# Patient Record
Sex: Male | Born: 1970 | Race: Black or African American | Hispanic: No | Marital: Married | Smoking: Never smoker
Health system: Southern US, Community
[De-identification: ages and names within clinical notes are randomized; demographics above are authoritative.]

## PROBLEM LIST (undated history)

## (undated) DIAGNOSIS — I10 Essential (primary) hypertension: Secondary | ICD-10-CM

## (undated) DIAGNOSIS — N289 Disorder of kidney and ureter, unspecified: Secondary | ICD-10-CM

## (undated) DIAGNOSIS — E785 Hyperlipidemia, unspecified: Secondary | ICD-10-CM

## (undated) DIAGNOSIS — E119 Type 2 diabetes mellitus without complications: Secondary | ICD-10-CM

## (undated) DIAGNOSIS — F329 Major depressive disorder, single episode, unspecified: Secondary | ICD-10-CM

## (undated) DIAGNOSIS — Z992 Dependence on renal dialysis: Secondary | ICD-10-CM

## (undated) DIAGNOSIS — N186 End stage renal disease: Secondary | ICD-10-CM

## (undated) DIAGNOSIS — F32A Depression, unspecified: Secondary | ICD-10-CM

## (undated) HISTORY — PX: EYE SURGERY: SHX253

---

## 2018-10-24 ENCOUNTER — Inpatient Hospital Stay (HOSPITAL_BASED_OUTPATIENT_CLINIC_OR_DEPARTMENT_OTHER)
Admission: EM | Admit: 2018-10-24 | Discharge: 2018-10-26 | DRG: 380 | Disposition: A | Discharge status: Home / Self Care | Payer: BLUE CROSS/BLUE SHIELD | Attending: Internal Medicine | Admitting: Internal Medicine

## 2018-10-24 ENCOUNTER — Other Ambulatory Visit: Payer: Self-pay

## 2018-10-24 ENCOUNTER — Encounter (HOSPITAL_BASED_OUTPATIENT_CLINIC_OR_DEPARTMENT_OTHER): Payer: Self-pay | Admitting: *Deleted

## 2018-10-24 ENCOUNTER — Emergency Department (HOSPITAL_BASED_OUTPATIENT_CLINIC_OR_DEPARTMENT_OTHER): Payer: BLUE CROSS/BLUE SHIELD

## 2018-10-24 DIAGNOSIS — I708 Atherosclerosis of other arteries: Secondary | ICD-10-CM | POA: Diagnosis present

## 2018-10-24 DIAGNOSIS — S0219XA Other fracture of base of skull, initial encounter for closed fracture: Secondary | ICD-10-CM | POA: Diagnosis present

## 2018-10-24 DIAGNOSIS — I6330 Cerebral infarction due to thrombosis of unspecified cerebral artery: Secondary | ICD-10-CM | POA: Diagnosis not present

## 2018-10-24 DIAGNOSIS — L97429 Non-pressure chronic ulcer of left heel and midfoot with unspecified severity: Secondary | ICD-10-CM | POA: Diagnosis present

## 2018-10-24 DIAGNOSIS — E876 Hypokalemia: Secondary | ICD-10-CM | POA: Diagnosis present

## 2018-10-24 DIAGNOSIS — E1122 Type 2 diabetes mellitus with diabetic chronic kidney disease: Secondary | ICD-10-CM | POA: Diagnosis present

## 2018-10-24 DIAGNOSIS — K3189 Other diseases of stomach and duodenum: Secondary | ICD-10-CM | POA: Diagnosis not present

## 2018-10-24 DIAGNOSIS — R7989 Other specified abnormal findings of blood chemistry: Secondary | ICD-10-CM | POA: Diagnosis present

## 2018-10-24 DIAGNOSIS — Z86010 Personal history of colonic polyps: Secondary | ICD-10-CM

## 2018-10-24 DIAGNOSIS — K2210 Ulcer of esophagus without bleeding: Secondary | ICD-10-CM

## 2018-10-24 DIAGNOSIS — W1809XA Striking against other object with subsequent fall, initial encounter: Secondary | ICD-10-CM | POA: Diagnosis present

## 2018-10-24 DIAGNOSIS — Z992 Dependence on renal dialysis: Secondary | ICD-10-CM | POA: Diagnosis not present

## 2018-10-24 DIAGNOSIS — I10 Essential (primary) hypertension: Secondary | ICD-10-CM | POA: Diagnosis not present

## 2018-10-24 DIAGNOSIS — K3184 Gastroparesis: Secondary | ICD-10-CM | POA: Diagnosis present

## 2018-10-24 DIAGNOSIS — E1151 Type 2 diabetes mellitus with diabetic peripheral angiopathy without gangrene: Secondary | ICD-10-CM | POA: Diagnosis present

## 2018-10-24 DIAGNOSIS — S0012XA Contusion of left eyelid and periocular area, initial encounter: Secondary | ICD-10-CM | POA: Diagnosis present

## 2018-10-24 DIAGNOSIS — E1143 Type 2 diabetes mellitus with diabetic autonomic (poly)neuropathy: Secondary | ICD-10-CM | POA: Diagnosis present

## 2018-10-24 DIAGNOSIS — R55 Syncope and collapse: Secondary | ICD-10-CM | POA: Diagnosis present

## 2018-10-24 DIAGNOSIS — E860 Dehydration: Secondary | ICD-10-CM | POA: Diagnosis present

## 2018-10-24 DIAGNOSIS — I428 Other cardiomyopathies: Secondary | ICD-10-CM | POA: Diagnosis present

## 2018-10-24 DIAGNOSIS — K2970 Gastritis, unspecified, without bleeding: Secondary | ICD-10-CM | POA: Diagnosis present

## 2018-10-24 DIAGNOSIS — I6381 Other cerebral infarction due to occlusion or stenosis of small artery: Secondary | ICD-10-CM | POA: Diagnosis present

## 2018-10-24 DIAGNOSIS — E1142 Type 2 diabetes mellitus with diabetic polyneuropathy: Secondary | ICD-10-CM | POA: Diagnosis present

## 2018-10-24 DIAGNOSIS — I70209 Unspecified atherosclerosis of native arteries of extremities, unspecified extremity: Secondary | ICD-10-CM | POA: Diagnosis present

## 2018-10-24 DIAGNOSIS — E785 Hyperlipidemia, unspecified: Secondary | ICD-10-CM | POA: Diagnosis present

## 2018-10-24 DIAGNOSIS — K2211 Ulcer of esophagus with bleeding: Principal | ICD-10-CM | POA: Diagnosis present

## 2018-10-24 DIAGNOSIS — Z833 Family history of diabetes mellitus: Secondary | ICD-10-CM

## 2018-10-24 DIAGNOSIS — I639 Cerebral infarction, unspecified: Secondary | ICD-10-CM

## 2018-10-24 DIAGNOSIS — Z79899 Other long term (current) drug therapy: Secondary | ICD-10-CM

## 2018-10-24 DIAGNOSIS — N186 End stage renal disease: Secondary | ICD-10-CM | POA: Diagnosis present

## 2018-10-24 DIAGNOSIS — F329 Major depressive disorder, single episode, unspecified: Secondary | ICD-10-CM | POA: Diagnosis present

## 2018-10-24 DIAGNOSIS — K920 Hematemesis: Secondary | ICD-10-CM | POA: Diagnosis present

## 2018-10-24 DIAGNOSIS — K2990 Gastroduodenitis, unspecified, without bleeding: Secondary | ICD-10-CM | POA: Diagnosis present

## 2018-10-24 DIAGNOSIS — E11621 Type 2 diabetes mellitus with foot ulcer: Secondary | ICD-10-CM | POA: Diagnosis present

## 2018-10-24 DIAGNOSIS — D631 Anemia in chronic kidney disease: Secondary | ICD-10-CM | POA: Diagnosis present

## 2018-10-24 DIAGNOSIS — I509 Heart failure, unspecified: Secondary | ICD-10-CM | POA: Diagnosis present

## 2018-10-24 DIAGNOSIS — Y92002 Bathroom of unspecified non-institutional (private) residence single-family (private) house as the place of occurrence of the external cause: Secondary | ICD-10-CM

## 2018-10-24 DIAGNOSIS — F32A Depression, unspecified: Secondary | ICD-10-CM | POA: Diagnosis present

## 2018-10-24 DIAGNOSIS — K208 Other esophagitis: Secondary | ICD-10-CM | POA: Diagnosis not present

## 2018-10-24 DIAGNOSIS — E119 Type 2 diabetes mellitus without complications: Secondary | ICD-10-CM | POA: Insufficient documentation

## 2018-10-24 DIAGNOSIS — R6881 Early satiety: Secondary | ICD-10-CM | POA: Diagnosis present

## 2018-10-24 DIAGNOSIS — R778 Other specified abnormalities of plasma proteins: Secondary | ICD-10-CM | POA: Diagnosis present

## 2018-10-24 DIAGNOSIS — Z7902 Long term (current) use of antithrombotics/antiplatelets: Secondary | ICD-10-CM

## 2018-10-24 DIAGNOSIS — I132 Hypertensive heart and chronic kidney disease with heart failure and with stage 5 chronic kidney disease, or end stage renal disease: Secondary | ICD-10-CM | POA: Diagnosis present

## 2018-10-24 HISTORY — DX: Type 2 diabetes mellitus without complications: E11.9

## 2018-10-24 HISTORY — DX: Depression, unspecified: F32.A

## 2018-10-24 HISTORY — DX: Dependence on renal dialysis: Z99.2

## 2018-10-24 HISTORY — DX: Hyperlipidemia, unspecified: E78.5

## 2018-10-24 HISTORY — DX: End stage renal disease: N18.6

## 2018-10-24 HISTORY — DX: Disorder of kidney and ureter, unspecified: N28.9

## 2018-10-24 HISTORY — DX: Essential (primary) hypertension: I10

## 2018-10-24 HISTORY — DX: Major depressive disorder, single episode, unspecified: F32.9

## 2018-10-24 LAB — APTT: aPTT: 30 seconds (ref 24–36)

## 2018-10-24 LAB — BASIC METABOLIC PANEL
Anion gap: 13 (ref 5–15)
BUN: 43 mg/dL — ABNORMAL HIGH (ref 6–20)
CO2: 28 mmol/L (ref 22–32)
Calcium: 8.1 mg/dL — ABNORMAL LOW (ref 8.9–10.3)
Chloride: 92 mmol/L — ABNORMAL LOW (ref 98–111)
Creatinine, Ser: 11.4 mg/dL — ABNORMAL HIGH (ref 0.61–1.24)
GFR calc Af Amer: 5 mL/min — ABNORMAL LOW (ref >60)
GFR calc non Af Amer: 5 mL/min — ABNORMAL LOW (ref >60)
Glucose, Bld: 141 mg/dL — ABNORMAL HIGH (ref 70–99)
Potassium: 3.1 mmol/L — ABNORMAL LOW (ref 3.5–5.1)
Sodium: 133 mmol/L — ABNORMAL LOW (ref 135–145)

## 2018-10-24 LAB — CBC
HCT: 33.8 % — ABNORMAL LOW (ref 39.0–52.0)
Hemoglobin: 10.9 g/dL — ABNORMAL LOW (ref 13.0–17.0)
MCH: 29.5 pg (ref 26.0–34.0)
MCHC: 32.2 g/dL (ref 30.0–36.0)
MCV: 91.6 fL (ref 80.0–100.0)
Platelets: 172 10*3/uL (ref 150–400)
RBC: 3.69 MIL/uL — ABNORMAL LOW (ref 4.22–5.81)
RDW: 14.3 % (ref 11.5–15.5)
WBC: 8.5 10*3/uL (ref 4.0–10.5)
nRBC: 0 % (ref 0.0–0.2)

## 2018-10-24 LAB — CBC WITH DIFFERENTIAL/PLATELET
Abs Immature Granulocytes: 0.02 10*3/uL (ref 0.00–0.07)
Basophils Absolute: 0 10*3/uL (ref 0.0–0.1)
Basophils Relative: 1 %
Eosinophils Absolute: 0.1 10*3/uL (ref 0.0–0.5)
Eosinophils Relative: 1 %
HCT: 34.4 % — ABNORMAL LOW (ref 39.0–52.0)
Hemoglobin: 11.2 g/dL — ABNORMAL LOW (ref 13.0–17.0)
Immature Granulocytes: 0 %
Lymphocytes Relative: 18 %
Lymphs Abs: 1.4 10*3/uL (ref 0.7–4.0)
MCH: 30.3 pg (ref 26.0–34.0)
MCHC: 32.6 g/dL (ref 30.0–36.0)
MCV: 93 fL (ref 80.0–100.0)
Monocytes Absolute: 0.8 10*3/uL (ref 0.1–1.0)
Monocytes Relative: 11 %
Neutro Abs: 5.3 10*3/uL (ref 1.7–7.7)
Neutrophils Relative %: 69 %
Platelets: 163 10*3/uL (ref 150–400)
RBC: 3.7 MIL/uL — ABNORMAL LOW (ref 4.22–5.81)
RDW: 14.4 % (ref 11.5–15.5)
WBC: 7.6 10*3/uL (ref 4.0–10.5)
nRBC: 0 % (ref 0.0–0.2)

## 2018-10-24 LAB — TYPE AND SCREEN
ABO/RH(D): A POS
Antibody Screen: NEGATIVE

## 2018-10-24 LAB — HEPATIC FUNCTION PANEL
ALT: 15 U/L (ref 0–44)
AST: 18 U/L (ref 15–41)
Albumin: 3.1 g/dL — ABNORMAL LOW (ref 3.5–5.0)
Alkaline Phosphatase: 47 U/L (ref 38–126)
Bilirubin, Direct: 0.2 mg/dL (ref 0.0–0.2)
Indirect Bilirubin: 0.6 mg/dL (ref 0.3–0.9)
Total Bilirubin: 0.8 mg/dL (ref 0.3–1.2)
Total Protein: 6.5 g/dL (ref 6.5–8.1)

## 2018-10-24 LAB — PROTIME-INR
INR: 1.1 (ref 0.8–1.2)
Prothrombin Time: 13.9 seconds (ref 11.4–15.2)

## 2018-10-24 LAB — CBG MONITORING, ED: Glucose-Capillary: 131 mg/dL — ABNORMAL HIGH (ref 70–99)

## 2018-10-24 LAB — TROPONIN I: Troponin I: 0.13 ng/mL (ref <0.03)

## 2018-10-24 LAB — GLUCOSE, CAPILLARY: Glucose-Capillary: 96 mg/dL (ref 70–99)

## 2018-10-24 MED ORDER — OXYCODONE-ACETAMINOPHEN 5-325 MG PO TABS: 1.0000 | ORAL_TABLET | ORAL | Status: DC | PRN

## 2018-10-24 MED ORDER — HYDRALAZINE HCL 20 MG/ML IJ SOLN
5.0000 mg | INTRAMUSCULAR | Status: DC | PRN
  Administered 2018-10-24 – 2018-10-26 (×2): 5 mg via INTRAVENOUS
  Filled 2018-10-24: qty 1

## 2018-10-24 MED ORDER — ATORVASTATIN CALCIUM 80 MG PO TABS
80.0000 mg | ORAL_TABLET | Freq: Every day | ORAL | Status: DC
  Administered 2018-10-24 – 2018-10-25 (×2): 80 mg via ORAL
  Filled 2018-10-24 (×2): qty 1

## 2018-10-24 MED ORDER — ISOSORBIDE MONONITRATE ER 60 MG PO TB24
60.0000 mg | ORAL_TABLET | Freq: Every day | ORAL | Status: DC
  Administered 2018-10-25: 60 mg via ORAL
  Filled 2018-10-24: qty 1

## 2018-10-24 MED ORDER — HYDRALAZINE HCL 50 MG PO TABS
100.0000 mg | ORAL_TABLET | Freq: Two times a day (BID) | ORAL | Status: DC
  Administered 2018-10-25: 100 mg via ORAL
  Filled 2018-10-24: qty 2

## 2018-10-24 MED ORDER — AMLODIPINE BESYLATE 5 MG PO TABS
5.0000 mg | ORAL_TABLET | Freq: Every day | ORAL | Status: DC
  Administered 2018-10-24 – 2018-10-25 (×2): 5 mg via ORAL
  Filled 2018-10-24 (×2): qty 1

## 2018-10-24 MED ORDER — ONDANSETRON HCL 4 MG/2ML IJ SOLN
4.0000 mg | Freq: Once | INTRAMUSCULAR | Status: AC
  Administered 2018-10-24: 4 mg via INTRAVENOUS
  Filled 2018-10-24: qty 2

## 2018-10-24 MED ORDER — POTASSIUM CHLORIDE 20 MEQ/15ML (10%) PO SOLN
20.0000 meq | Freq: Once | ORAL | Status: AC
  Administered 2018-10-24: 20 meq via ORAL
  Filled 2018-10-24: qty 15

## 2018-10-24 MED ORDER — SODIUM CHLORIDE 0.9 % IV SOLN
INTRAVENOUS | Status: DC
  Administered 2018-10-24: 18:00:00 via INTRAVENOUS

## 2018-10-24 MED ORDER — PANTOPRAZOLE SODIUM 40 MG IV SOLR
40.0000 mg | Freq: Two times a day (BID) | INTRAVENOUS | Status: DC
  Administered 2018-10-24 – 2018-10-25 (×3): 40 mg via INTRAVENOUS
  Filled 2018-10-24 (×3): qty 40

## 2018-10-24 MED ORDER — CARVEDILOL 25 MG PO TABS: 25.0000 mg | ORAL_TABLET | Freq: Two times a day (BID) | ORAL | Status: DC

## 2018-10-24 MED ORDER — CALCITRIOL 0.25 MCG PO CAPS
0.2500 ug | ORAL_CAPSULE | Freq: Every day | ORAL | Status: DC
  Administered 2018-10-25: 0.25 ug via ORAL
  Filled 2018-10-24: qty 1

## 2018-10-24 MED ORDER — CARVEDILOL 12.5 MG PO TABS
12.5000 mg | ORAL_TABLET | Freq: Two times a day (BID) | ORAL | Status: DC
  Administered 2018-10-25 (×2): 12.5 mg via ORAL
  Filled 2018-10-24 (×2): qty 1

## 2018-10-24 MED ORDER — FENTANYL CITRATE (PF) 100 MCG/2ML IJ SOLN
50.0000 ug | Freq: Once | INTRAMUSCULAR | Status: AC
  Administered 2018-10-24: 50 ug via INTRAVENOUS
  Filled 2018-10-24: qty 2

## 2018-10-24 MED ORDER — ESCITALOPRAM OXALATE 10 MG PO TABS
10.0000 mg | ORAL_TABLET | Freq: Every day | ORAL | Status: DC
  Administered 2018-10-24 – 2018-10-25 (×2): 10 mg via ORAL
  Filled 2018-10-24 (×2): qty 1

## 2018-10-24 MED ORDER — PANTOPRAZOLE SODIUM 40 MG IV SOLR
40.0000 mg | Freq: Once | INTRAVENOUS | Status: AC
  Administered 2018-10-24: 40 mg via INTRAVENOUS
  Filled 2018-10-24: qty 40

## 2018-10-24 MED ORDER — ZOLPIDEM TARTRATE 5 MG PO TABS
5.0000 mg | ORAL_TABLET | Freq: Every evening | ORAL | Status: DC | PRN
  Administered 2018-10-24: 5 mg via ORAL
  Filled 2018-10-24: qty 1

## 2018-10-24 MED ORDER — ONDANSETRON HCL 4 MG/2ML IJ SOLN
4.0000 mg | Freq: Four times a day (QID) | INTRAMUSCULAR | Status: DC | PRN
  Administered 2018-10-24: 4 mg via INTRAVENOUS
  Filled 2018-10-24: qty 2

## 2018-10-24 MED ORDER — ONDANSETRON HCL 4 MG PO TABS: 4.0000 mg | ORAL_TABLET | Freq: Four times a day (QID) | ORAL | Status: DC | PRN

## 2018-10-24 MED ORDER — ACETAMINOPHEN 325 MG PO TABS: 650.0000 mg | ORAL_TABLET | Freq: Four times a day (QID) | ORAL | Status: DC | PRN

## 2018-10-24 MED ORDER — ISOSORBIDE MONONITRATE ER 30 MG PO TB24: 30.0000 mg | ORAL_TABLET | Freq: Every day | ORAL | Status: DC

## 2018-10-24 MED ORDER — SODIUM CHLORIDE 0.9 % IV BOLUS
500.0000 mL | Freq: Once | INTRAVENOUS | Status: AC
  Administered 2018-10-24: 500 mL via INTRAVENOUS

## 2018-10-24 MED ORDER — MORPHINE SULFATE (PF) 2 MG/ML IV SOLN
1.0000 mg | INTRAVENOUS | Status: DC | PRN
  Administered 2018-10-24: 1 mg via INTRAVENOUS
  Filled 2018-10-24: qty 1

## 2018-10-24 MED ORDER — SODIUM CHLORIDE 0.9 % IV SOLN
INTRAVENOUS | Status: DC
  Administered 2018-10-24: via INTRAVENOUS

## 2018-10-24 MED ORDER — SEVELAMER CARBONATE 800 MG PO TABS
800.0000 mg | ORAL_TABLET | Freq: Three times a day (TID) | ORAL | Status: DC
  Administered 2018-10-25: 10:00:00 800 mg via ORAL
  Filled 2018-10-24: qty 1

## 2018-10-24 MED ORDER — NITROGLYCERIN 0.4 MG SL SUBL: 0.4000 mg | SUBLINGUAL_TABLET | SUBLINGUAL | Status: DC | PRN

## 2018-10-24 NOTE — H&P (Addendum)
History and Physical    Jesse Mcgee HYW:[id number] DOB: 1971-02-26 DOA: 10/24/2018  Referring MD/NP/PA:   PCP: System, Pcp Not In   Patient coming from:  The patient is coming from home.  At baseline, pt is independent for most of ADL.        Chief Complaint: Syncope and hematemesis  HPI: Jesse Mcgee is a 48 y.o. male with medical history significant of ESRD-on nightly peritoneal dialysis, hypertension, hyperlipidemia, depression, diet-controlled diabetes, who presents with syncope and hematemesis.  Pt states that he passed out for approximately 3 seconds after he had a bowel movement this afternoon, caused injury to his left eyebrow area, with a small hematoma formation. He states that he felt fatigue, but otherwise no prodromal symptoms.  Patient denies unilateral weakness, numbness or tingling to extremities.  No facial droop or slurred speech.  Denies any chest pain, shortness breath, cough.  No fever or chills.  Patient states that after the event, he had 2 episodes of hematemesis with small amount of blood.  He denies nausea vomiting, diarrhea or abdominal pain.  Denies dark stool or rectal bleeding.  Denies symptoms of UTI. He states that he had negative colonoscopy approximately 1 year ago at [facility].  He states that he did not have EGD in the past.  He is not taking aspirin or NSAIDs. Per EDP's note, pt was orthostatic in the ED.  ED Course: pt was found to have hemoglobin 11.2 (no baseline hemoglobin available), trop 0.13, WBC 7.6, INR 1.1, potassium 3.1, bicarbonate 28, creatinine 11.4, BUN 43, normal LFT, temperature normal, heart rate 70, oxygen saturation 97% on room air.  Blood pressure elevated 206/117-->176/106. Pt is accepted to SDU as inpt by accepting MD.  Dr. Constance Holster of ENT, Dr. Silverio Decamp of [facility] and Dr. Justin Mend of nephrology were consulted.  CT-head showed:  1. Acute comminuted slightly depressed fracture of the frontal wall of the left frontal sinus with  associated small hemosinus. The fracture extends inferiorly to the superior medial orbit. 2.  No evidence of acute intracranial hemorrhage or globe injury. 3. Intracranial atherosclerosis and mild white matter disease  Review of Systems:   General: no fevers, chills, no body weight gain, has fatigue HEENT: no blurry vision, hearing changes or sore throat Respiratory: no dyspnea, coughing, wheezing CV: no chest pain, no palpitations GI: no nausea, vomiting, abdominal pain, diarrhea, constipation. Has hematemesis GU: no dysuria, burning on urination, increased urinary frequency, hematuria  Ext: no leg edema Neuro: no unilateral weakness, numbness, or tingling, no vision change or hearing loss. Had syncope. Skin: no rash, no skin tear. MSK: No muscle spasm, no deformity, no limitation of range of movement in spin Heme: No easy bruising.  Travel history: No recent long distant travel.  Allergy: No Known Allergies  Past Medical History:  Diagnosis Date   Depression    Diabetes mellitus without complication ([place])    ESRD on peritoneal dialysis ([place])    HLD (hyperlipidemia)    HTN (hypertension)    Renal disorder     Past Surgical History:  Procedure Laterality Date   EYE SURGERY      Social History:  reports that he has never smoked. He has never used smokeless tobacco. He reports that he does not drink alcohol or use drugs.  Family History:  Family History  Problem Relation Age of Onset   Diabetes Mellitus II Mother    Diabetes Mellitus II Father    Diabetes Mellitus II Sister  Diabetes Mellitus II Brother      Prior to Admission medications   Medication Sig Start Date End Date Taking? Authorizing Provider  amLODipine (NORVASC) 5 MG tablet Take by mouth. 10/20/15  Yes [provider]  atorvastatin (LIPITOR) 80 MG tablet Take by mouth. 10/04/15  Yes [provider]  calcitRIOL (ROCALTROL) 0.25 MCG capsule  07/12/18  Yes [provider]  carvedilol (COREG) 25 MG tablet Take by mouth. 09/26/18  Yes [provider]  clopidogrel (PLAVIX) 75 MG tablet TAKE 1 TABLET BY MOUTH DAILY FOR 30 DAYS. START DATE: 07/26/17 STOP DATE: 07/29/17 **PAGED MD TO CLARIFY DATES** 07/10/17  Yes [provider]  escitalopram (LEXAPRO) 10 MG tablet  03/06/18  Yes [provider]  hydrALAZINE (APRESOLINE) 100 MG tablet Take by mouth.   Yes [provider]  isosorbide mononitrate (IMDUR) 30 MG 24 hr tablet Take by mouth. 09/26/18  Yes [provider]  isosorbide mononitrate (IMDUR) 60 MG 24 hr tablet Take by mouth. 11/14/16  Yes [provider]  metoCLOPramide (REGLAN) 5 MG tablet  10/01/18  Yes [provider]  mupirocin ointment (BACTROBAN) 2 % Apply topically. 01/10/17  Yes [provider]  sevelamer carbonate (RENVELA) 800 MG tablet Take by mouth. 03/13/18  Yes [provider]  silver sulfADIAZINE (SILVADENE) 1 % cream Apply topically. 09/11/18  Yes [provider]  zolpidem (AMBIEN) 10 MG tablet Take by mouth. 05/02/17  Yes [provider]    Physical Exam: Vitals:   10/24/18 1734 10/24/18 1800 10/24/18 2220 10/24/18 2334  BP: (!) 188/108 (!) 176/106 (!) 195/107 (!) 171/99  Pulse: 67 70 76 81  Resp: 20 12 14    Temp:   98.2 F (36.8 C) 98.3 F (36.8 C)  TempSrc:   Oral Oral  SpO2: 98% 98% 100% 100%  Weight:      Height:       General: Not in acute distress HEENT: has tenderness to left eyebrow area, with a small hematoma.       Eyes: PERRL, EOMI, no scleral icterus.       ENT: No discharge from the ears and nose, no pharynx injection, no tonsillar enlargement.        Neck: No JVD, no bruit, no mass felt. Heme: No neck lymph node enlargement. Cardiac: S1/S2, RRR, No murmurs, No gallops or rubs. Respiratory: No rales, wheezing, rhonchi or rubs. GI: Soft, nondistended, nontender, no rebound pain, no organomegaly, BS present. Has PD tube in place with  clean surroundings. GU: No hematuria Ext: No pitting leg edema bilaterally. 2+DP/PT pulse bilaterally. Musculoskeletal: No joint deformities, No joint redness or warmth, no limitation of ROM in spin. Skin: No rashes.  Neuro: Alert, oriented X3, cranial nerves II-XII grossly intact, moves all extremities normally. Muscle strength 5/5 in all extremities, sensation to light touch intact. Brachial reflex 2+ bilaterally. Knee reflex 1+ bilaterally. Psych: Patient is not psychotic, no suicidal or hemocidal ideation.  Labs on Admission: I have personally reviewed following labs and imaging studies  CBC: Recent Labs  Lab 10/24/18 1524 10/24/18 2137  WBC 7.6 8.5  NEUTROABS 5.3  --   HGB 11.2* 10.9*  HCT 34.4* 33.8*  MCV 93.0 91.6  PLT 163 540   Basic Metabolic Panel: Recent Labs  Lab 10/24/18 1524  NA 133*  K 3.1*  CL 92*  CO2 28  GLUCOSE 141*  BUN 43*  CREATININE 11.40*  CALCIUM 8.1*   GFR: Estimated Creatinine Clearance: 8.2 mL/min (A) (by  C-G formula based on SCr of 11.4 mg/dL (H)). Liver Function Tests: Recent Labs  Lab 10/24/18 1524  AST 18  ALT 15  ALKPHOS 47  BILITOT 0.8  PROT 6.5  ALBUMIN 3.1*   No results for input(s): LIPASE, AMYLASE in the last 168 hours. No results for input(s): AMMONIA in the last 168 hours. Coagulation Profile: Recent Labs  Lab 10/24/18 1605  INR 1.1   Cardiac Enzymes: Recent Labs  Lab 10/24/18 2145  TROPONINI 0.13*   BNP (last 3 results) No results for input(s): PROBNP in the last 8760 hours. HbA1C: No results for input(s): HGBA1C in the last 72 hours. CBG: Recent Labs  Lab 10/24/18 1515 10/24/18 2220  GLUCAP 131* 96   Lipid Profile: No results for input(s): CHOL, HDL, LDLCALC, TRIG, CHOLHDL, LDLDIRECT in the last 72 hours. Thyroid Function Tests: No results for input(s): TSH, T4TOTAL, FREET4, T3FREE, THYROIDAB in the last 72 hours. Anemia Panel: No results for input(s): VITAMINB12, FOLATE, FERRITIN, TIBC, IRON,  RETICCTPCT in the last 72 hours. Urine analysis: No results found for: COLORURINE, APPEARANCEUR, LABSPEC, PHURINE, GLUCOSEU, HGBUR, BILIRUBINUR, KETONESUR, PROTEINUR, UROBILINOGEN, NITRITE, LEUKOCYTESUR Sepsis Labs: @LABRCNTIP (procalcitonin:4,lacticidven:4) )No results found for this or any previous visit (from the past 240 hour(s)).   Radiological Exams on Admission: Ct Head Wo Contrast  Result Date: 10/24/2018 CLINICAL DATA:  48 year old male with syncope EXAM: CT HEAD WITHOUT CONTRAST TECHNIQUE: Contiguous axial images were obtained from the base of the skull through the vertex without intravenous contrast. COMPARISON:  None. FINDINGS: Brain: No acute intracranial hemorrhage. No midline shift or mass effect. Gray-white differentiation maintained. Patchy hypodensity in the right frontal white matter overlying the ventricle. Unremarkable appearance of the ventricular system. Vascular: Intracranial atherosclerosis Skull: Acute comminuted slightly depressed fracture of the frontal wall of the left frontal sinus with air-fluid level of the frontal sinus. The fracture extends to the left superior orbit and the superior medial orbital wall. No evidence of globe injury. Mild soft tissue swelling. The depressed fracture results in mild contour abnormality of the left frontal scalp soft tissues in the supraorbital region. No additional fracture identified. Fracture does not extend superiorly Sinuses/Orbits: No evidence of globe injury. Air-fluid level of the left frontal sinus. Other: None IMPRESSION: Acute comminuted slightly depressed fracture of the frontal wall of the left frontal sinus with associated small hemosinus. The fracture extends inferiorly to the superior medial orbit. No evidence of acute intracranial hemorrhage or globe injury. Intracranial atherosclerosis and mild white matter disease Electronically Signed   By: Corrie Mckusick D.O.   On: 10/24/2018 15:49     EKG: Independently reviewed.   Sinus rhythm, QTC 483, LAE, mild T wave inversion in lateral leads and V5-V6.  Assessment/Plan Principal Problem:   Hematemesis Active Problems:   Depression   ESRD on peritoneal dialysis ([place])   HLD (hyperlipidemia)   HTN (hypertension)   Syncope   Hypokalemia   Closed fracture of frontal sinus ([facility])   Elevated troponin   Hematemesis: Etiology is not clear.  Patient states that he had negative colonoscopy approximately 1 year ago at [facility].  He did not have EGD in the past.  He is not taking aspirin or NSAIDs.  Currently hemodynamically stable.  Hemoglobin 11.2. Dr. Silverio Decamp of GI was consulted. Nurse has informed GI of pt's arrival.  - will admit to SDU as inpt by accepting MD - GI consulted by Ed, will follow up recommendations - NPO for possible EGD - IVF: 500 cc NS bolus, then at  50 mL/hr - Start IV pantoprazole 40 mg bid - Zofran IV for nausea - Avoid NSAIDs and SQ heparin - Maintain IV access (2 large bore IVs if possible). - Monitor closely and follow q6h cbc, transfuse as necessary, if Hgb<7.0 - LaB: INR, PTT and type screen  Syncope and fall: pt was orthostatic per ED physician's note, which may have partially contributed.  Patient also has positive troponin, but no chest pain, still concerning for cardiac etiology.  Patient also has multiple risk for TIA or stroke.  We need to rule out this possibility. - Trending troponins x3  - MRI-brain - 2d echo - Neuro checks  - IVF: 500 cc of NS in ED, then NS 50 cc/h  Addendum: MRI showed 6 mm acute ischemic nonhemorrhagic infarct involving the posterior left periventricular white matter/left splenium. - Dr. Leonel Ramsay of neurology was consulted-->will f/u recommendations. - Check carotid dopplers  - no ASA or plavix due to GIB - f/u fasting lipid panel and HbA1c, UDS - 2D transthoracic echocardiography  - swallowing screen. If fails, will get SLP - PT/OT consult - further workup will f/u neurology's  recommendation  Elevated trop: trop 0.13. no CP.  May be due to demand ischemia secondary to elevated blood pressure and GI bleeding. - cycle CE q6 x3 and repeat EKG in the am  - prn Nitroglycerin, Morphine, and lipitor and imdur - no ASA due to GIB - Risk factor stratification: will check FLP and A1C, UDS - 2d echo  Depression: -Lexapro  ESRD on peritoneal dialysis [facility]): No signs of fluid overload. K=3.1 -Dr. Justin Mend of renal was consulted-->will do HD tomorrow  HLD (hyperlipidemia): -Lipitor  HTN:  -Continue home medications: Hydralazine, Coreg, amlodipine -IV hydralazine prn  Hypokalemia: K=3.1 -give 20 mEQ of KCl  Closed fracture of frontal sinus ([place]): EDP discussed case with Dr. Constance Holster. "He will see in consultation. No antibiotics for now. Nurse has informed Dr. Constance Holster of pt's arrival-->will see in AM. -prn percocet and morphine for pain -f/u ENT recommendations   Inpatient status:  # Patient requires inpatient status due to high intensity of service, high risk for further deterioration and high frequency of surveillance required.  I certify that at the point of admission it is my clinical judgment that the patient will require inpatient hospital care spanning beyond 2 midnights from the point of admission.   This patient has multiple chronic comorbidities including ESRD-on nightly peritoneal dialysis, hypertension, hyperlipidemia, depression, diet-controlled diabetes.  Now patient has presenting with syncope, fall, hematemesis, fracture of frontal sinus, elevated troponin.  The worrisome physical exam findings include hematoma and tenderness in left elbow area   The initial radiographic and laboratory data are worrisome because of elevated troponin, hypokalemia, left frontal sinus fracture on CT scan  Current medical needs: please see my assessment and plan  Predictability of an adverse outcome (risk): Patient has multiple comorbidities, now presents with syncope,  fall, hematemesis, fracture of frontal sinus, elevated troponin.  He is at high risk of deteriorating.  Will need to be treated in hospital for at least 2 days.     DVT ppx: SCD Code Status: Full code Family Communication:  Yes, patient's  Wife on the phone Disposition Plan:  Anticipate discharge back to previous home environment Consults called: Dr. Constance Holster of ENT, Dr. Silverio Decamp of [facility] and Dr. Justin Mend of nephrology Admission status:   SDU/inpation       Date of Service 10/25/2018    [facility]  If 7PM-7AM, please contact night-coverage [email or web address] Password [email or web address] 10/25/2018, 2:26 AM

## 2018-10-24 NOTE — Plan of Care (Signed)
48 yo male with esrd on PD coming from [place].he is hypertensive and orthostatic.hb 11.no nsaid or anticoags.EDP placed call to GI.pl call gi when he gets here.i will start ppi and ivf and serial h/h

## 2018-10-24 NOTE — ED Notes (Signed)
Pt. Has Peritoneal dialysis at night he does his own at night every night.

## 2018-10-24 NOTE — ED Notes (Signed)
Patient transported to CT 

## 2018-10-24 NOTE — ED Notes (Signed)
EDP reports to RN That the Pt. Was C Spine Cleared will report this to [facility] on the arrival

## 2018-10-24 NOTE — ED Provider Notes (Signed)
[facility] EMERGENCY DEPARTMENT Provider Note   CSN: [id number] Arrival date & time: 10/24/18  1456    History   Chief Complaint Chief Complaint  Patient presents with  . Loss of Consciousness    HPI Jesse Mcgee is a 48 y.o. male.     HPI  48 year old male presents after syncope and head injury.  The patient states that he has not had anything to eat or drink today.  He states he woke up late and just did not feel very hungry and nothing tastes good.  He has not been drinking today.  He was having a bowel movement and after he was finished he stood up and felt acutely lightheaded and then passed out.  Wife told him he hit his head on the toilet.  He has pain and ecchymosis over his left eyebrow.  Some pain on his left trapezius.  He is feeling some tingling to his bilateral palms over the last hour or so.  No weakness.  Never had any chest pain, shortness of breath, or palpitations.  He has been doing his peritoneal dialysis nightly.  He is not sure what meds he is on but he knows he is on a lot and his wife typically manages this. He's not sure if he's on a blood thinner. He tried to eat an egg after the injury and vomited it back up. Feels a little nauseated. Took 2 tylenol prior to arrival, headache is still an 8/10.  Past Medical History:  Diagnosis Date  . Diabetes mellitus without complication ([place])   . Renal disorder     There are no active problems to display for this patient.   Past Surgical History:  Procedure Laterality Date  . EYE SURGERY          Home Medications    Prior to Admission medications   Medication Sig Start Date End Date Taking? Authorizing Provider  amLODipine (NORVASC) 5 MG tablet Take by mouth. 10/20/15  Yes [provider]  atorvastatin (LIPITOR) 80 MG tablet Take by mouth. 10/04/15  Yes [provider]  calcitRIOL (ROCALTROL) 0.25 MCG capsule  07/12/18  Yes [provider]  carvedilol (COREG) 25 MG  tablet Take by mouth. 09/26/18  Yes [provider]  clopidogrel (PLAVIX) 75 MG tablet TAKE 1 TABLET BY MOUTH DAILY FOR 30 DAYS. START DATE: 07/26/17 STOP DATE: 07/29/17 **PAGED MD TO CLARIFY DATES** 07/10/17  Yes [provider]  escitalopram (LEXAPRO) 10 MG tablet  03/06/18  Yes [provider]  hydrALAZINE (APRESOLINE) 100 MG tablet Take by mouth.   Yes [provider]  isosorbide mononitrate (IMDUR) 30 MG 24 hr tablet Take by mouth. 09/26/18  Yes [provider]  isosorbide mononitrate (IMDUR) 60 MG 24 hr tablet Take by mouth. 11/14/16  Yes [provider]  metoCLOPramide (REGLAN) 5 MG tablet  10/01/18  Yes [provider]  mupirocin ointment (BACTROBAN) 2 % Apply topically. 01/10/17  Yes [provider]  sevelamer carbonate (RENVELA) 800 MG tablet Take by mouth. 03/13/18  Yes [provider]  silver sulfADIAZINE (SILVADENE) 1 % cream Apply topically. 09/11/18  Yes [provider]  zolpidem (AMBIEN) 10 MG tablet Take by mouth. 05/02/17  Yes [provider]    Family History No family history on file.  Social History Social History   Tobacco Use  . Smoking status: Never Smoker  . Smokeless tobacco: Never Used  Substance Use Topics  . Alcohol use: Never  Frequency: Never  . Drug use: Never     Allergies   Patient has no known allergies.   Review of Systems Review of Systems  Constitutional: Negative for fever.  Respiratory: Negative for shortness of breath.   Cardiovascular: Negative for chest pain and palpitations.  Gastrointestinal: Positive for vomiting. Negative for abdominal pain.  Musculoskeletal: Positive for neck pain.  Neurological: Positive for numbness and headaches. Negative for weakness.  All other systems reviewed and are negative.    Physical Exam Updated Vital Signs BP (!) 206/117   Pulse 64   Temp 97.6 F (36.4 C) (Oral)   Resp 18   Ht 6\' 3"  (1.905 m)   Wt 74.8  kg   SpO2 99%   BMI 20.62 kg/m   Physical Exam Vitals signs and nursing note reviewed.  Constitutional:      General: He is not in acute distress.    Appearance: He is well-developed. He is not ill-appearing.  HENT:     Head: Normocephalic.      Right Ear: External ear normal.     Left Ear: External ear normal.     Nose: Nose normal.  Eyes:     General:        Right eye: No discharge.        Left eye: No discharge.     Extraocular Movements: Extraocular movements intact.     Pupils: Pupils are equal, round, and reactive to light.  Neck:     Musculoskeletal: Normal range of motion and neck supple. Muscular tenderness present. No spinous process tenderness.   Cardiovascular:     Rate and Rhythm: Normal rate and regular rhythm.     Heart sounds: Normal heart sounds.  Pulmonary:     Effort: Pulmonary effort is normal.     Breath sounds: Normal breath sounds.  Abdominal:     Palpations: Abdomen is soft.     Tenderness: There is no abdominal tenderness.  Skin:    General: Skin is warm and dry.  Neurological:     Mental Status: He is alert.     Comments: CN 3-12 grossly intact. 5/5 strength in all 4 extremities. Grossly normal sensation, including in hands. Normal finger to nose.   Psychiatric:        Mood and Affect: Mood is not anxious.      ED Treatments / Results  Labs (all labs ordered are listed, but only abnormal results are displayed) Labs Reviewed  BASIC METABOLIC PANEL - Abnormal; Notable for the following components:      Result Value   Sodium 133 (*)    Potassium 3.1 (*)    Chloride 92 (*)    Glucose, Bld 141 (*)    BUN 43 (*)    Creatinine, Ser 11.40 (*)    Calcium 8.1 (*)    GFR calc non Af Amer 5 (*)    GFR calc Af Amer 5 (*)    All other components within normal limits  CBC WITH DIFFERENTIAL/PLATELET - Abnormal; Notable for the following components:   RBC 3.70 (*)    Hemoglobin 11.2 (*)    HCT 34.4 (*)    All other components within normal  limits  HEPATIC FUNCTION PANEL - Abnormal; Notable for the following components:   Albumin 3.1 (*)    All other components within normal limits  CBG MONITORING, ED - Abnormal; Notable for the following components:   Glucose-Capillary 131 (*)    All other components within normal limits  PROTIME-INR    EKG EKG Interpretation  Date/Time:  Thursday October 24 2018 15:08:20 EDT Ventricular Rate:  62 PR Interval:    QRS Duration: 101 QT Interval:  475 QTC Calculation: 483 R Axis:   4 Text Interpretation:  Sinus rhythm LVH with secondary repolarization abnormality Borderline prolonged QT interval No old tracing to compare Confirmed by Sherwood Gambler [phone]) on 10/24/2018 3:13:15 PM   Radiology Ct Head Wo Contrast  Result Date: 10/24/2018 CLINICAL DATA:  48 year old male with syncope EXAM: CT HEAD WITHOUT CONTRAST TECHNIQUE: Contiguous axial images were obtained from the base of the skull through the vertex without intravenous contrast. COMPARISON:  None. FINDINGS: Brain: No acute intracranial hemorrhage. No midline shift or mass effect. Gray-white differentiation maintained. Patchy hypodensity in the right frontal white matter overlying the ventricle. Unremarkable appearance of the ventricular system. Vascular: Intracranial atherosclerosis Skull: Acute comminuted slightly depressed fracture of the frontal wall of the left frontal sinus with air-fluid level of the frontal sinus. The fracture extends to the left superior orbit and the superior medial orbital wall. No evidence of globe injury. Mild soft tissue swelling. The depressed fracture results in mild contour abnormality of the left frontal scalp soft tissues in the supraorbital region. No additional fracture identified. Fracture does not extend superiorly Sinuses/Orbits: No evidence of globe injury. Air-fluid level of the left frontal sinus. Other: None IMPRESSION: Acute comminuted slightly depressed fracture of the frontal wall of the left  frontal sinus with associated small hemosinus. The fracture extends inferiorly to the superior medial orbit. No evidence of acute intracranial hemorrhage or globe injury. Intracranial atherosclerosis and mild white matter disease Electronically Signed   By: Corrie Mckusick D.O.   On: 10/24/2018 15:49    Procedures Procedures (including critical care time)  Medications Ordered in ED Medications  sodium chloride 0.9 % bolus 500 mL (500 mLs Intravenous New Bag/Given 10/24/18 1555)  fentaNYL (SUBLIMAZE) injection 50 mcg (50 mcg Intravenous Given 10/24/18 1545)  ondansetron (ZOFRAN) injection 4 mg (4 mg Intravenous Given 10/24/18 1540)  pantoprazole (PROTONIX) injection 40 mg (40 mg Intravenous Given 10/24/18 1626)     Initial Impression / Assessment and Plan / ED Course  I have reviewed the triage vital signs and the nursing notes.  Pertinent labs & imaging results that were available during my care of the patient were reviewed by me and considered in my medical decision making (see chart for details).  Clinical Course as of Oct 23 1852  Thu Oct 24, 2018  1547 Patient came back from CT scan and started throwing up.  Most of it appears to be bright red blood.  He states that this afternoon after eating the egg there was a lot of blood in that.  He threw up once last night but no blood.  No blood in his stools, including today with a bowel movement.  Vitals remain hypertensive but now I am concerned about an acute upper GI bleed.   [SG]  1654 I discussed case with Dr. Constance Holster. He will see in consultation. No antibiotics for now. No posterior wall involvement. I am worried about GI bleed, so hospitalist (Dr. Zigmund Daniel) consulted for admission. Asks for GI consult. Given protonix already. Is orthostatic.   [SG]  1717 I discussed with Dr. Stevphen [place] GI team will consult.   [SG]    Clinical Course User Index [SG] Sherwood Gambler, MD       With fentanyl and zofran he is feeling much better, no  further vomiting. He  tells me he did have emesis last night (no blood) and nausea all day today. No ETOH use. No ASA/NSAIDs. He has not had any drainage since the injury, so I don't think he's vomiting blood he's swallowing from sinus injury.   Final Clinical Impressions(s) / ED Diagnoses   Final diagnoses:  Hematemesis with nausea  Closed fracture of frontal sinus, initial encounter [facility])    ED Discharge Orders    None       Sherwood Gambler, MD 10/24/18 [id number]

## 2018-10-24 NOTE — ED Notes (Signed)
In to be with the Pt. After his CT scan.  Pt. Vomiting blood and food. Immediately called for EDP and staff.  Pt. In no distress but needed meds and IVFs.   Pt. Getting at time of EDP reassessment and Pt. Tolerating well.  Pt. Giving information about past history of vomiting blood and treatment of.  Pt. To be seen by Gertie Fey in the next week per the pt.

## 2018-10-24 NOTE — ED Notes (Signed)
Spoke with pt. Wife Burnis Halling about Pt. Poss. Plan of being admitted after EDP speaks with [facility]. And about the Pt. CT scan results being discussed with the Pt. By the EDP.

## 2018-10-24 NOTE — ED Triage Notes (Signed)
He did not eat today and states while having a BM he passed out. He fell and hit his left eyebrow. Small hematoma noted. No break in the skin. He is alert and oriented. He is a diabetic and does home peritoneal dialysis.

## 2018-10-25 ENCOUNTER — Inpatient Hospital Stay (HOSPITAL_COMMUNITY): Payer: BLUE CROSS/BLUE SHIELD

## 2018-10-25 ENCOUNTER — Encounter (HOSPITAL_COMMUNITY): Payer: Self-pay | Admitting: Nephrology

## 2018-10-25 DIAGNOSIS — I6330 Cerebral infarction due to thrombosis of unspecified cerebral artery: Secondary | ICD-10-CM

## 2018-10-25 DIAGNOSIS — I639 Cerebral infarction, unspecified: Secondary | ICD-10-CM

## 2018-10-25 DIAGNOSIS — R7989 Other specified abnormal findings of blood chemistry: Secondary | ICD-10-CM

## 2018-10-25 DIAGNOSIS — R55 Syncope and collapse: Secondary | ICD-10-CM

## 2018-10-25 LAB — CBC
HCT: 30.6 % — ABNORMAL LOW (ref 39.0–52.0)
HCT: 31.1 % — ABNORMAL LOW (ref 39.0–52.0)
Hemoglobin: 10 g/dL — ABNORMAL LOW (ref 13.0–17.0)
Hemoglobin: 10.2 g/dL — ABNORMAL LOW (ref 13.0–17.0)
MCH: 29.8 pg (ref 26.0–34.0)
MCH: 29.7 pg (ref 26.0–34.0)
MCHC: 32.7 g/dL (ref 30.0–36.0)
MCHC: 32.8 g/dL (ref 30.0–36.0)
MCV: 91.1 fL (ref 80.0–100.0)
MCV: 90.7 fL (ref 80.0–100.0)
Platelets: 172 K/uL (ref 150–400)
Platelets: 171 10*3/uL (ref 150–400)
RBC: 3.36 MIL/uL — ABNORMAL LOW (ref 4.22–5.81)
RBC: 3.43 MIL/uL — ABNORMAL LOW (ref 4.22–5.81)
RDW: 14.5 % (ref 11.5–15.5)
RDW: 14.4 % (ref 11.5–15.5)
WBC: 8.3 K/uL (ref 4.0–10.5)
WBC: 8.1 10*3/uL (ref 4.0–10.5)
nRBC: 0 % (ref 0.0–0.2)
nRBC: 0 % (ref 0.0–0.2)

## 2018-10-25 LAB — HEMOGLOBIN AND HEMATOCRIT, BLOOD
HCT: 29.1 % — ABNORMAL LOW (ref 39.0–52.0)
Hemoglobin: 9.5 g/dL — ABNORMAL LOW (ref 13.0–17.0)

## 2018-10-25 LAB — BASIC METABOLIC PANEL WITH GFR
Anion gap: 14 (ref 5–15)
BUN: 48 mg/dL — ABNORMAL HIGH (ref 6–20)
CO2: 29 mmol/L (ref 22–32)
Calcium: 8.2 mg/dL — ABNORMAL LOW (ref 8.9–10.3)
Chloride: 94 mmol/L — ABNORMAL LOW (ref 98–111)
Creatinine, Ser: 12.42 mg/dL — ABNORMAL HIGH (ref 0.61–1.24)
GFR calc Af Amer: 5 mL/min — ABNORMAL LOW
GFR calc non Af Amer: 4 mL/min — ABNORMAL LOW
Glucose, Bld: 90 mg/dL (ref 70–99)
Potassium: 3.6 mmol/L (ref 3.5–5.1)
Sodium: 137 mmol/L (ref 135–145)

## 2018-10-25 LAB — LIPID PANEL
Cholesterol: 132 mg/dL (ref 0–200)
HDL: 37 mg/dL — ABNORMAL LOW (ref >40)
LDL Cholesterol: 80 mg/dL (ref 0–99)
Total CHOL/HDL Ratio: 3.6 RATIO
Triglycerides: 74 mg/dL (ref <150)
VLDL: 15 mg/dL (ref 0–40)

## 2018-10-25 LAB — TROPONIN I
Troponin I: 0.12 ng/mL (ref <0.03)
Troponin I: 0.16 ng/mL (ref <0.03)
Troponin I: 0.16 ng/mL (ref <0.03)

## 2018-10-25 LAB — ECHOCARDIOGRAM LIMITED
Height: 75 in
Weight: 2779.56 oz

## 2018-10-25 LAB — HEMOGLOBIN A1C
Hgb A1c MFr Bld: 7.5 % — ABNORMAL HIGH (ref 4.8–5.6)
Mean Plasma Glucose: 168.55 mg/dL

## 2018-10-25 LAB — ABO/RH: ABO/RH(D): A POS

## 2018-10-25 MED ORDER — HEPARIN 1000 UNIT/ML FOR PERITONEAL DIALYSIS
INTRAPERITONEAL | Status: DC | PRN
  Filled 2018-10-25: qty 3000

## 2018-10-25 MED ORDER — HYDRALAZINE HCL 50 MG PO TABS
100.0000 mg | ORAL_TABLET | Freq: Two times a day (BID) | ORAL | Status: DC
  Administered 2018-10-25: 100 mg via ORAL
  Filled 2018-10-25: qty 2

## 2018-10-25 MED ORDER — ASPIRIN EC 81 MG PO TBEC
81.0000 mg | DELAYED_RELEASE_TABLET | Freq: Every day | ORAL | Status: DC
  Administered 2018-10-25: 81 mg via ORAL
  Filled 2018-10-25: qty 1

## 2018-10-25 MED ORDER — HEPARIN 1000 UNIT/ML FOR PERITONEAL DIALYSIS: 500.0000 [IU] | INTRAMUSCULAR | Status: DC | PRN

## 2018-10-25 MED ORDER — GENTAMICIN SULFATE 0.1 % EX CREA
1.0000 "application " | TOPICAL_CREAM | Freq: Every day | CUTANEOUS | Status: DC
  Administered 2018-10-25: 1 via TOPICAL
  Filled 2018-10-25: qty 15

## 2018-10-25 MED ORDER — DELFLEX-LC/2.5% DEXTROSE 394 MOSM/L IP SOLN: INTRAPERITONEAL | Status: DC

## 2018-10-25 MED ORDER — STROKE: EARLY STAGES OF RECOVERY BOOK
Freq: Once | Status: AC
  Administered 2018-10-25: 10:00:00
  Filled 2018-10-25: qty 1

## 2018-10-25 MED ORDER — AMLODIPINE BESYLATE 5 MG PO TABS: 5.0000 mg | ORAL_TABLET | Freq: Every day | ORAL | Status: DC

## 2018-10-25 MED ORDER — SODIUM CHLORIDE 0.9 % IV BOLUS
500.0000 mL | Freq: Once | INTRAVENOUS | Status: AC
  Administered 2018-10-25: 500 mL via INTRAVENOUS

## 2018-10-25 NOTE — Progress Notes (Signed)
PROGRESS NOTE    Jesse Mcgee   IDP:[id number]  DOB: 1971/01/04  DOA: 10/24/2018 PCP: System, Pcp Not In   Brief Narrative:  Jesse Mcgee  is a 48 y.o. male with medical history significant of ESRD-on nightly peritoneal dialysis, hypertension, hyperlipidemia, depression, diet-controlled diabetes, who presents with syncope and hematemesis. He was getting up from the commode after having a BM and passed out for what he thinks was a few seconds. He subsequently had a couple episodes of vomiting which had blood in them. He had one more episode in the ED of vomiting with some blood.  An MRI to further work up the syncope showed a periventricular white matter/left splenium. 6 mm acute ischemic nonhemorrhagic infarct involving the posterior left   periventricular white matter/left splenium.  Subjective: He has no complaints today. No further vomiting and neurological issues.   Assessment & Plan:   Principal Problem:   Hematemesis - has resolved for now- GI plans to do EGD tomorrow-  - his Hb is stable  Active Problems:   Syncope - either orthostatic of vasovagal in relation to BM and vomiting.  - he received IVF for hypotension in the ED - awaiting further orthostatics to decide of more IVF needed  Anemia of chronic disease - Hb is 10-11 range- follow    ESRD on peritoneal dialysis  - have notified nephrology    Cerebral thrombosis with cerebral infarction - incidental finding of infarct- will f/u on ECHO and carotid ultrasound - transcranial doppler ordered by Dr Leonie Man - stroke team has evaluated the patient and recommends a baby ASA- will hold off on starting this until EGD can be completed tomorrow and we have more information on whether it will be safe to give Aspirin    HTN (hypertension) - on Imdur, Hydralazine, Coreg     Closed fracture of frontal sinus  - ENT consulted- will be repaired as outpt    Elevated troponin - likely due to ESRD- he does not recall  having any chest pain prior to or after the syncope    Time spent in minutes: 35 DVT prophylaxis: SCDs Code Status: Full code Family Communication:  Disposition Plan: f/u on GI and stroke work up Consultants:   GI  NEuro Procedures:   none Antimicrobials:  Anti-infectives (From admission, onward)   None       Objective: Vitals:   10/24/18 1800 10/24/18 2220 10/24/18 2334 10/25/18 0343  BP: (!) 176/106 (!) 195/107 (!) 171/99 (!) 172/88  Pulse: 70 76 81 80  Resp: 12 14  (!) 25  Temp:  98.2 F (36.8 C) 98.3 F (36.8 C) 98.2 F (36.8 C)  TempSrc:  Oral Oral Oral  SpO2: 98% 100% 100% 100%  Weight:    78.8 kg  Height:        Intake/Output Summary (Last 24 hours) at 10/25/2018 1243 Last data filed at 10/25/2018 1000 Gross per 24 hour  Intake 100 ml  Output -  Net 100 ml   Filed Weights   10/24/18 1506 10/25/18 0343  Weight: 74.8 kg 78.8 kg    Examination: General exam: Appears comfortable  HEENT: PERRLA, oral mucosa moist, no sclera icterus or thrush Respiratory system: Clear to auscultation. Respiratory effort normal. Cardiovascular system: S1 & S2 heard, RRR.   Gastrointestinal system: Abdomen soft, non-tender, nondistended. Normal bowel sounds. Dialysis cath in place Central nervous system: Alert and oriented. No focal neurological deficits. Extremities: No cyanosis, clubbing or edema Skin: No rashes or ulcers Psychiatry:  Mood & affect appropriate.   Data Reviewed: I have personally reviewed following labs and imaging studies  CBC: Recent Labs  Lab 10/24/18 1524 10/24/18 2137 10/25/18 0428 10/25/18 0751  WBC 7.6 8.5 8.3 8.1  NEUTROABS 5.3  --   --   --   HGB 11.2* 10.9* 10.0* 10.2*  HCT 34.4* 33.8* 30.6* 31.1*  MCV 93.0 91.6 91.1 90.7  PLT 163 172 172 786   Basic Metabolic Panel: Recent Labs  Lab 10/24/18 1524 10/25/18 0428  NA 133* 137  K 3.1* 3.6  CL 92* 94*  CO2 28 29  GLUCOSE 141* 90  BUN 43* 48*  CREATININE 11.40* 12.42*   CALCIUM 8.1* 8.2*   GFR: Estimated Creatinine Clearance: 7.9 mL/min (A) (by C-G formula based on SCr of 12.42 mg/dL (H)). Liver Function Tests: Recent Labs  Lab 10/24/18 1524  AST 18  ALT 15  ALKPHOS 47  BILITOT 0.8  PROT 6.5  ALBUMIN 3.1*   No results for input(s): LIPASE, AMYLASE in the last 168 hours. No results for input(s): AMMONIA in the last 168 hours. Coagulation Profile: Recent Labs  Lab 10/24/18 1605  INR 1.1   Cardiac Enzymes: Recent Labs  Lab 10/24/18 2145 10/25/18 0428 10/25/18 0751  TROPONINI 0.13* 0.12* 0.16*   BNP (last 3 results) No results for input(s): PROBNP in the last 8760 hours. HbA1C: Recent Labs    10/25/18 0428  HGBA1C 7.5*   CBG: Recent Labs  Lab 10/24/18 1515 10/24/18 2220  GLUCAP 131* 96   Lipid Profile: Recent Labs    10/25/18 0428  CHOL 132  HDL 37*  LDLCALC 80  TRIG 74  CHOLHDL 3.6   Thyroid Function Tests: No results for input(s): TSH, T4TOTAL, FREET4, T3FREE, THYROIDAB in the last 72 hours. Anemia Panel: No results for input(s): VITAMINB12, FOLATE, FERRITIN, TIBC, IRON, RETICCTPCT in the last 72 hours. Urine analysis: No results found for: COLORURINE, APPEARANCEUR, LABSPEC, PHURINE, GLUCOSEU, HGBUR, BILIRUBINUR, KETONESUR, PROTEINUR, UROBILINOGEN, NITRITE, LEUKOCYTESUR Sepsis Labs: @LABRCNTIP (procalcitonin:4,lacticidven:4) )No results found for this or any previous visit (from the past 240 hour(s)).       Radiology Studies: Ct Head Wo Contrast  Result Date: 10/24/2018 CLINICAL DATA:  48 year old male with syncope EXAM: CT HEAD WITHOUT CONTRAST TECHNIQUE: Contiguous axial images were obtained from the base of the skull through the vertex without intravenous contrast. COMPARISON:  None. FINDINGS: Brain: No acute intracranial hemorrhage. No midline shift or mass effect. Gray-white differentiation maintained. Patchy hypodensity in the right frontal white matter overlying the ventricle. Unremarkable appearance of  the ventricular system. Vascular: Intracranial atherosclerosis Skull: Acute comminuted slightly depressed fracture of the frontal wall of the left frontal sinus with air-fluid level of the frontal sinus. The fracture extends to the left superior orbit and the superior medial orbital wall. No evidence of globe injury. Mild soft tissue swelling. The depressed fracture results in mild contour abnormality of the left frontal scalp soft tissues in the supraorbital region. No additional fracture identified. Fracture does not extend superiorly Sinuses/Orbits: No evidence of globe injury. Air-fluid level of the left frontal sinus. Other: None IMPRESSION: Acute comminuted slightly depressed fracture of the frontal wall of the left frontal sinus with associated small hemosinus. The fracture extends inferiorly to the superior medial orbit. No evidence of acute intracranial hemorrhage or globe injury. Intracranial atherosclerosis and mild white matter disease Electronically Signed   By: Corrie Mckusick D.O.   On: 10/24/2018 15:49   Mr Brain Wo Contrast  Result Date: 10/25/2018 CLINICAL DATA:  Initial evaluation for acute syncope. EXAM: MRI HEAD WITHOUT CONTRAST TECHNIQUE: Multiplanar, multiecho pulse sequences of the brain and surrounding structures were obtained without intravenous contrast. COMPARISON:  Prior CT from 10/24/2018 FINDINGS: Brain: Diffuse prominence of the CSF containing spaces compatible with generalized age-related cerebral atrophy. Patchy and confluent T2/FLAIR hyperintensity within the periventricular white matter most consistent with chronic small vessel ischemic disease, mild to moderate nature. Remote lacunar infarcts seen involving the bilateral basal ganglia/corona radiata, left larger than right. Associated chronic hemosiderin staining about the chronic left basal ganglia infarct. Chronic microvascular ischemic changes noted within the pons as well. No mass lesion, midline shift, or mass effect. Mild  ex vacuo dilatation of the left lateral ventricle related to the chronic left basal ganglia lacunar infarct. No hydrocephalus. No discernible extra-axial fluid collection. Pituitary gland suprasellar region normal. Midline structures intact. 6 mm focus of restricted diffusion involving the left posterior periventricular white matter/splenium compatible with an acute ischemic infarct. No associated hemorrhage or mass effect. No other abnormal foci of restricted diffusion to suggest acute or subacute ischemia. Gray-white matter differentiation otherwise maintained. No encephalomalacia to suggest chronic cortical infarction. No other evidence for acute or chronic intracranial hemorrhage. Vascular: Major intracranial vascular flow voids are well maintained. Skull and upper cervical spine: Craniocervical junction within normal limits. Upper cervical spine normal. Comminuted fracture involving the outer table of the left frontal sinus with associated hemosinus again noted, better seen on prior CT. Sinuses/Orbits: Patient status post bilateral ocular lens replacement. Scattered mucosal thickening noted within the ethmoidal air cells. Layering blood within the left frontal sinus. Trace bilateral mastoid effusions, of doubtful significance. Inner ear structures grossly normal. Other: None. IMPRESSION: 1. 6 mm acute ischemic nonhemorrhagic infarct involving the posterior left periventricular white matter/left splenium. 2. Comminuted fracture involving the outer table of the left frontal sinus with associated hemosinus, better seen on prior CT. 3. Chronic lacunar infarcts involving the bilateral basal ganglia/corona radiata, left greater than right. Evidence for prior hemorrhage about the chronic left basal ganglia lacunar infarct. 4. Underlying age-related cerebral atrophy with moderate chronic microvascular ischemic disease. Electronically Signed   By: Jeannine Boga M.D.   On: 10/25/2018 03:58      Scheduled  Meds: . [START ON 10/26/2018] amLODipine  5 mg Oral Daily  . aspirin EC  81 mg Oral Daily  . atorvastatin  80 mg Oral q1800  . calcitRIOL  0.25 mcg Oral Daily  . carvedilol  12.5 mg Oral BID WC  . escitalopram  10 mg Oral Daily  . hydrALAZINE  100 mg Oral BID  . isosorbide mononitrate  60 mg Oral Daily  . pantoprazole (PROTONIX) IV  40 mg Intravenous Q12H  . sevelamer carbonate  800 mg Oral TID WC   Continuous Infusions: . sodium chloride 50 mL/hr at 10/25/18 0948     LOS: 1 day      Debbe Odea, MD [facility] Pager: [email or web address] Password [facility] 10/25/2018, 12:43 PM

## 2018-10-25 NOTE — Consult Note (Signed)
Neurology Consultation Reason for Consult: Stroke Referring Physician: Mora Bellman  CC: Syncope  History is obtained from: Patient, chart  HPI: Jesse Mcgee is a 48 y.o. male with a history of diabetes, hyperlipidemia, hypertension, ESRD who was in his normal state of health earlier today.  He states that he felt like his "sugar was low" because he had not eaten anything and went to the bathroom.  He does think he may have been slurring his speech slightly, but states that this happens from time to time.  He then had a bowel movement and about 3 seconds later, as he was standing up from the commode, he briefly lost consciousness.  He estimates that he was unconscious for less than a second.  His wife is in the next room and was there immediately.  He had no postictal confusion.  When he came to, he had 2 episodes of hematemesis with a small amount of blood  Due to the syncope, a work-up was pursued including MRI of the brain and the MRI shows a small incidental stroke   LKW: Unclear tpa given?: no, hematemesis   ROS: A 14 point ROS was performed and is negative except as noted in the HPI.  Past Medical History:  Diagnosis Date  . Depression   . Diabetes mellitus without complication ([place])   . ESRD on peritoneal dialysis ([place])   . HLD (hyperlipidemia)   . HTN (hypertension)   . Renal disorder      Family History  Problem Relation Age of Onset  . Diabetes Mellitus II Mother   . Diabetes Mellitus II Father   . Diabetes Mellitus II Sister   . Diabetes Mellitus II Brother      Social History:  reports that he has never smoked. He has never used smokeless tobacco. He reports that he does not drink alcohol or use drugs.   Exam: Current vital signs: BP (!) 172/88 (BP Location: Right Arm)   Pulse 80   Temp 98.2 F (36.8 C) (Oral)   Resp (!) 25   Ht 6\' 3"  (1.905 m)   Wt 78.8 kg   SpO2 100%   BMI 21.71 kg/m  Vital signs in last 24 hours: Temp:  [97.6 F (36.4 C)-98.3 F  (36.8 C)] 98.2 F (36.8 C) (05/01 0343) Pulse Rate:  [63-81] 80 (05/01 0343) Resp:  [12-25] 25 (05/01 0343) BP: (171-206)/(88-117) 172/88 (05/01 0343) SpO2:  [97 %-100 %] 100 % (05/01 0343) Weight:  [74.8 kg-78.8 kg] 78.8 kg (05/01 0343)   Physical Exam  Constitutional: Appears well-developed and well-nourished.  Psych: Affect appropriate to situation Eyes: No scleral injection HENT: No OP obstrucion Head: Normocephalic.  Cardiovascular: Normal rate and regular rhythm.  Respiratory: Effort normal, non-labored breathing GI: Soft.  No distension. There is no tenderness.  Skin: WDI  Neuro: Mental Status: Patient is awake, alert, oriented to person, place, month, year, and situation. Patient is able to give a clear and coherent history. No signs of aphasia or neglect Cranial Nerves: II: Visual Fields are full. Pupils are equal, round, and reactive to light.   III,IV, VI: EOMI without ptosis or diploplia.  V: Facial sensation is symmetric to temperature VII: Facial movement is symmetric.  VIII: hearing is intact to voice X: Uvula elevates symmetrically XI: Shoulder shrug is symmetric. XII: tongue is midline without atrophy or fasciculations.  Motor: Tone is normal. Bulk is normal. 5/5 strength was present in all four extremities.  Sensory: Sensation is symmetric to light touch and  temperature in the arms and legs. 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  This would not explain his syncope.    A strong contender would be a vagal response due to Valsalva with his bowel movement.  In this setting, a small area that was already tenuous and at risk due to small vessel disease could be injured.    Though not directly because small vessel disease in the typical fashion, I do think that secondary prevention would entail management of his small vessel risk factors.  With his hematemesis, will hold on antiplatelets for now.  Recommendations: 1) A1c, lipids 2) telemetry, echo 3) hold on antiplatelets until cleared by GI 4) PT, OT, ST 5) stroke team to follow   Roland Rack, MD [facility] [phone]  If 7pm- 7am, please page neurology on call as listed in [place].

## 2018-10-25 NOTE — Progress Notes (Signed)
Transcranial Doppler completed. Preliminary results in Chart review CV proc. [place] Kameran Mcneese,RVS 10/25/2018, 5:11 PM

## 2018-10-25 NOTE — Progress Notes (Signed)
CRITICAL VALUE ALERT crtical Value:  Troponin 0.13  Date & Time Notied:  10/24/18 @  3967  Provider Notified: Ivor Costa  Orders Received/Actions taken: non

## 2018-10-25 NOTE — Progress Notes (Signed)
SLP Cancellation Note  Patient Details Name: Jesse Mcgee MRN: [id number] DOB: 1970-12-09   Cancelled treatment:       Reason Eval/Treat Not Completed: SLP screened, no needs identified, will sign off   Dannial Monarch 10/25/2018, 10:36 AM    Sonia Baller, MA, CCC-SLP Speech Therapy [facility] Acute Rehab Pager: [phone]

## 2018-10-25 NOTE — Progress Notes (Signed)
  Echocardiogram 2D Echocardiogram has been performed.  Jennette Dubin 10/25/2018, 8:47 AM

## 2018-10-25 NOTE — Evaluation (Signed)
Occupational Therapy Evaluation Patient Details Name: Jesse Mcgee MRN: [id number] DOB: Nov 13, 1970 Today's Date: 10/25/2018    History of Present Illness Jesse Mcgee  is a 48 y.o. male with medical history significant of ESRD-on nightly peritoneal dialysis, hypertension, hyperlipidemia, depression, diet-controlled diabetes, who presents with syncope and hematemesis.   Clinical Impression   Pt PTA: living with family and independent prior "waiting on a kidney." Pt currently performing ADL functional mobility limitedly as dizziness is so severe. No nystagmus noted at this time, but pt not standing for  >30 secs at a time. Pt performing bed mobility with modified independence. Pt performing ADL  Tasks with increased assists required as pt is dizzy. Pt repositioned in bed BP 1134/83. Pt requires continued OT for safety, ADL and mobility. OT to follow acutely.    Follow Up Recommendations  Home health OT;Supervision - Intermittent    Equipment Recommendations  None recommended by OT    Recommendations for Other Services       Precautions / Restrictions Precautions Precautions: Fall Restrictions Weight Bearing Restrictions: No      Mobility Bed Mobility Overal bed mobility: Modified Independent             General bed mobility comments: no assist required  Transfers Overall transfer level: Modified independent Equipment used: None             General transfer comment: dizziness reported    Balance Overall balance assessment: Needs assistance   Sitting balance-Leahy Scale: Good       Standing balance-Leahy Scale: Fair                             ADL either performed or assessed with clinical judgement   ADL Overall ADL's : Needs assistance/impaired Eating/Feeding: Set up;Sitting   Grooming: Set up;Sitting   Upper Body Bathing: Set up;Sitting   Lower Body Bathing: Minimal assistance;Sitting/lateral leans;Sit to/from stand   Upper Body  Dressing : Set up;Sitting   Lower Body Dressing: Minimal assistance;Sitting/lateral leans;Sit to/from stand;Cueing for safety   Toilet Transfer: Minimal assistance;Comfort height toilet;Ambulation   Toileting- Clothing Manipulation and Hygiene: Minimal assistance;Sitting/lateral lean;Sit to/from stand;Cueing for safety       Functional mobility during ADLs: Minimal assistance;Rolling walker;Cueing for safety General ADL Comments: Pt dizzy with any positional change. BP after exertion 134/83. Pt in standing an hour earlier 77/50. Pt seated for most ADL and safest in sitting at this time,     Vision Baseline Vision/History: No visual deficits Vision Assessment?: No apparent visual deficits;Vision impaired- to be further tested in functional context Additional Comments: test to be sure no nystagmus     Perception     Praxis      Pertinent Vitals/Pain Pain Assessment: Faces Faces Pain Scale: Hurts a little bit Pain Location: face Pain Descriptors / Indicators: Aching     Hand Dominance Right   Extremity/Trunk Assessment Upper Extremity Assessment Upper Extremity Assessment: Generalized weakness   Lower Extremity Assessment Lower Extremity Assessment: Generalized weakness   Cervical / Trunk Assessment Cervical / Trunk Assessment: Normal   Communication Communication Communication: No difficulties   Cognition Arousal/Alertness: Awake/alert Behavior During Therapy: WFL for tasks assessed/performed Overall Cognitive Status: Within Functional Limits for tasks assessed                                     General Comments  Exercises     Shoulder Instructions      Home Living Family/patient expects to be discharged to:: Private residence Living Arrangements: Spouse/significant other Available Help at Discharge: Family;Available 24 hours/day Type of Home: House Home Access: Level entry     Home Layout: One level     Bathroom Shower/Tub:  Teacher, early years/pre: Standard     Home Equipment: Environmental consultant - 2 wheels;Cane - single point          Prior Functioning/Environment Level of Independence: Independent                 OT Problem List: Decreased strength;Decreased activity tolerance;Impaired balance (sitting and/or standing);Decreased coordination;Decreased safety awareness      OT Treatment/Interventions: Therapeutic exercise;Self-care/ADL training;Neuromuscular education;Energy conservation;Therapeutic activities;Patient/family education;Balance training    OT Goals(Current goals can be found in the care plan section) Acute Rehab OT Goals Patient Stated Goal: go home OT Goal Formulation: With patient Time For Goal Achievement: 11/08/18 Potential to Achieve Goals: Good ADL Goals Pt Will Perform Grooming: with modified independence;standing Pt Will Transfer to Toilet: with modified independence;ambulating Pt Will Perform Toileting - Clothing Manipulation and hygiene: with modified independence;sit to/from stand Additional ADL Goal #1: Pt will stand x5 mins for ADL tasks at sink with fair balance Additional ADL Goal #2: pt will ambulate a household distance with fair balance with least restrictive device and good safety awareness.  OT Frequency: Min 3X/week   Barriers to D/C:            Co-evaluation              AM-PAC OT "6 Clicks" Daily Activity     Outcome Measure Help from another person eating meals?: None Help from another person taking care of personal grooming?: A Little Help from another person toileting, which includes using toliet, bedpan, or urinal?: A Little Help from another person bathing (including washing, rinsing, drying)?: A Little Help from another person to put on and taking off regular upper body clothing?: A Little Help from another person to put on and taking off regular lower body clothing?: A Little 6 Click Score: 19   End of Session Equipment Utilized  During Treatment: Gait belt;Rolling walker Nurse Communication: Mobility status  Activity Tolerance: Patient tolerated treatment well;Treatment limited secondary to medical complications (Comment) Patient left: in bed;with call bell/phone within reach;with bed alarm set  OT Visit Diagnosis: Unsteadiness on feet (R26.81);Other abnormalities of gait and mobility (R26.89);Muscle weakness (generalized) (M62.81)                Time: 5997-7414 OT Time Calculation (min): 18 min Charges:  OT General Charges $OT Visit: 1 Visit OT Evaluation $OT Eval Moderate Complexity: 1 Mod  Darryl Nestle) Marsa Aris OTR/L Acute Rehabilitation Services Pager: [phone] Office: [place] 10/25/2018, 5:11 PM

## 2018-10-25 NOTE — Progress Notes (Signed)
Pt admitted from [facility] by [facility],  Admission doctor, ENT and GI on call all notified, he is  dialysis pt said he does peritoneal dialysis every night, on call nurse practitioner together with the admission doctor made aware,  on arrival to the floor pt was fully alert x4, oriented to the floor and pt care equipment, has vomited x2 which looks coffee ground color, BP running high iv hydralazine given, all other prescribed treated started, pt has been encourage to call for help when needed and the need to prevent fall, admission package given, skin assessment done, has old wound at both foot and abrasion at rt shoulder and rt leg from fall at home   will continue to monitor pt,

## 2018-10-25 NOTE — Consult Note (Signed)
Reason for Consult: Facial injury Referring Physician: Ivor Costa, MD  Jesse Mcgee is an 48 y.o. male.  HPI: History of a fall yesterday.  Being worked up by the medical service for possible GI bleed.  His only complaint is tenderness along the left frontal region.  Vision is intact.  He has no other head neck complaints.  He feels that he hit on the corner of a table just above the left eye.  Past Medical History:  Diagnosis Date  . Depression   . Diabetes mellitus without complication ([place])   . ESRD on peritoneal dialysis ([place])   . HLD (hyperlipidemia)   . HTN (hypertension)   . Renal disorder     Past Surgical History:  Procedure Laterality Date  . EYE SURGERY      Family History  Problem Relation Age of Onset  . Diabetes Mellitus II Mother   . Diabetes Mellitus II Father   . Diabetes Mellitus II Sister   . Diabetes Mellitus II Brother     Social History:  reports that he has never smoked. He has never used smokeless tobacco. He reports that he does not drink alcohol or use drugs.  Allergies: No Known Allergies  Medications: Reviewed  Results for orders placed or performed during the hospital encounter of 10/24/18 (from the past 48 hour(s))  POC CBG, ED     Status: Abnormal   Collection Time: 10/24/18  3:15 PM  Result Value Ref Range   Glucose-Capillary 131 (H) 70 - 99 mg/dL  Basic metabolic panel     Status: Abnormal   Collection Time: 10/24/18  3:24 PM  Result Value Ref Range   Sodium 133 (L) 135 - 145 mmol/L   Potassium 3.1 (L) 3.5 - 5.1 mmol/L   Chloride 92 (L) 98 - 111 mmol/L   CO2 28 22 - 32 mmol/L   Glucose, Bld 141 (H) 70 - 99 mg/dL   BUN 43 (H) 6 - 20 mg/dL   Creatinine, Ser 11.40 (H) 0.61 - 1.24 mg/dL   Calcium 8.1 (L) 8.9 - 10.3 mg/dL   GFR calc non Af Amer 5 (L) >60 mL/min   GFR calc Af Amer 5 (L) >60 mL/min   Anion gap 13 5 - 15    Comment: Performed at [facility] [address] [place], [place] [address]  CBC with  Differential     Status: Abnormal   Collection Time: 10/24/18  3:24 PM  Result Value Ref Range   WBC 7.6 4.0 - 10.5 K/uL   RBC 3.70 (L) 4.22 - 5.81 MIL/uL   Hemoglobin 11.2 (L) 13.0 - 17.0 g/dL   HCT 34.4 (L) 39.0 - 52.0 %   MCV 93.0 80.0 - 100.0 fL   MCH 30.3 26.0 - 34.0 pg   MCHC 32.6 30.0 - 36.0 g/dL   RDW 14.4 11.5 - 15.5 %   Platelets 163 150 - 400 K/uL   nRBC 0.0 0.0 - 0.2 %   Neutrophils Relative % 69 %   Neutro Abs 5.3 1.7 - 7.7 K/uL   Lymphocytes Relative 18 %   Lymphs Abs 1.4 0.7 - 4.0 K/uL   Monocytes Relative 11 %   Monocytes Absolute 0.8 0.1 - 1.0 K/uL   Eosinophils Relative 1 %   Eosinophils Absolute 0.1 0.0 - 0.5 K/uL   Basophils Relative 1 %   Basophils Absolute 0.0 0.0 - 0.1 K/uL   Immature Granulocytes 0 %   Abs Immature Granulocytes 0.02 0.00 -  0.07 K/uL    Comment: Performed at [facility], [address] [place], [place] [address]  Hepatic function panel     Status: Abnormal   Collection Time: 10/24/18  3:24 PM  Result Value Ref Range   Total Protein 6.5 6.5 - 8.1 g/dL   Albumin 3.1 (L) 3.5 - 5.0 g/dL   AST 18 15 - 41 U/L   ALT 15 0 - 44 U/L   Alkaline Phosphatase 47 38 - 126 U/L   Total Bilirubin 0.8 0.3 - 1.2 mg/dL   Bilirubin, Direct 0.2 0.0 - 0.2 mg/dL   Indirect Bilirubin 0.6 0.3 - 0.9 mg/dL    Comment: Performed at [facility], [address] [place], [place] [address]  Protime-INR     Status: None   Collection Time: 10/24/18  4:05 PM  Result Value Ref Range   Prothrombin Time 13.9 11.4 - 15.2 seconds   INR 1.1 0.8 - 1.2    Comment: (NOTE) INR goal varies based on device and disease states. Performed at [facility], [address] [place], [place] [address]   CBC     Status: Abnormal   Collection Time: 10/24/18  9:37 PM  Result Value Ref Range   WBC 8.5 4.0 - 10.5 K/uL   RBC 3.69 (L) 4.22 - 5.81 MIL/uL   Hemoglobin 10.9 (L) 13.0 - 17.0 g/dL   HCT 33.8 (L) 39.0 - 52.0 %   MCV 91.6 80.0 - 100.0 fL    MCH 29.5 26.0 - 34.0 pg   MCHC 32.2 30.0 - 36.0 g/dL   RDW 14.3 11.5 - 15.5 %   Platelets 172 150 - 400 K/uL   nRBC 0.0 0.0 - 0.2 %    Comment: Performed at [facility], [place] [address] [place], [place] [address]  APTT     Status: None   Collection Time: 10/24/18  9:37 PM  Result Value Ref Range   aPTT 30 24 - 36 seconds    Comment: Performed at [facility] [address] [place], [place] [address]  Type and screen [place]     Status: None   Collection Time: 10/24/18  9:37 PM  Result Value Ref Range   ABO/RH(D) A POS    Antibody Screen NEG    Sample Expiration      10/27/2018 Performed at [facility], [place] [address] [place], [place] [address]   ABO/Rh     Status: None   Collection Time: 10/24/18  9:37 PM  Result Value Ref Range   ABO/RH(D)      A POS Performed at [facility] [address], [place], [place] [address]   Troponin I - Add-On to previous collection     Status: Abnormal   Collection Time: 10/24/18  9:45 PM  Result Value Ref Range   Troponin I 0.13 (HH) <0.03 ng/mL    Comment: CRITICAL RESULT CALLED TO, READ BACK BY AND VERIFIED WITH: OSEI,L RN 10/24/2018 2249 [address] Performed at [facility], [place] [address] [place], [place] [address]   Glucose, capillary     Status: None   Collection Time: 10/24/18 10:20 PM  Result Value Ref Range   Glucose-Capillary 96 70 - 99 mg/dL  CBC     Status: Abnormal   Collection Time: 10/25/18  4:28 AM  Result Value Ref Range   WBC 8.3 4.0 - 10.5 K/uL  RBC 3.36 (L) 4.22 - 5.81 MIL/uL   Hemoglobin 10.0 (L) 13.0 - 17.0 g/dL   HCT 30.6 (L) 39.0 - 52.0 %   MCV 91.1 80.0 - 100.0 fL   MCH 29.8 26.0 - 34.0 pg   MCHC 32.7 30.0 - 36.0 g/dL   RDW 14.5 11.5 - 15.5 %   Platelets 172 150 - 400 K/uL   nRBC 0.0 0.0 - 0.2 %    Comment: Performed at [facility] [address], [place], [place] [address]  Basic metabolic panel     Status: Abnormal   Collection Time:  10/25/18  4:28 AM  Result Value Ref Range   Sodium 137 135 - 145 mmol/L   Potassium 3.6 3.5 - 5.1 mmol/L   Chloride 94 (L) 98 - 111 mmol/L   CO2 29 22 - 32 mmol/L   Glucose, Bld 90 70 - 99 mg/dL   BUN 48 (H) 6 - 20 mg/dL   Creatinine, Ser 12.42 (H) 0.61 - 1.24 mg/dL   Calcium 8.2 (L) 8.9 - 10.3 mg/dL   GFR calc non Af Amer 4 (L) >60 mL/min   GFR calc Af Amer 5 (L) >60 mL/min   Anion gap 14 5 - 15    Comment: Performed at [facility] [address], [place], [place] [address]  Troponin I - Now Then Q6H     Status: Abnormal   Collection Time: 10/25/18  4:28 AM  Result Value Ref Range   Troponin I 0.12 (HH) <0.03 ng/mL    Comment: CRITICAL VALUE NOTED.  VALUE IS CONSISTENT WITH PREVIOUSLY REPORTED AND CALLED VALUE. Performed at [facility], [place] [address], [place], [place] [address]   Lipid panel     Status: Abnormal   Collection Time: 10/25/18  4:28 AM  Result Value Ref Range   Cholesterol 132 0 - 200 mg/dL   Triglycerides 74 <150 mg/dL   HDL 37 (L) >40 mg/dL   Total CHOL/HDL Ratio 3.6 RATIO   VLDL 15 0 - 40 mg/dL   LDL Cholesterol 80 0 - 99 mg/dL    Comment:        Total Cholesterol/HDL:CHD Risk Coronary Heart Disease Risk Table                     Men   Women  1/2 Average Risk   3.4   3.3  Average Risk       5.0   4.4  2 X Average Risk   9.6   7.1  3 X Average Risk  23.4   11.0        Use the calculated Patient Ratio above and the CHD Risk Table to determine the patient's CHD Risk.        ATP III CLASSIFICATION (LDL):  <100     mg/dL   Optimal  100-129  mg/dL   Near or Above                    Optimal  130-159  mg/dL   Borderline  160-189  mg/dL   High  >190     mg/dL   Very High Performed at [facility] [address] [place], [place] [address]     Ct Head Wo Contrast  Result Date: 10/24/2018 CLINICAL DATA:  48 year old male with syncope EXAM: CT HEAD WITHOUT CONTRAST TECHNIQUE: Contiguous axial images were obtained from the base of the  skull through the  vertex without intravenous contrast. COMPARISON:  None. FINDINGS: Brain: No acute intracranial hemorrhage. No midline shift or mass effect. Gray-white differentiation maintained. Patchy hypodensity in the right frontal white matter overlying the ventricle. Unremarkable appearance of the ventricular system. Vascular: Intracranial atherosclerosis Skull: Acute comminuted slightly depressed fracture of the frontal wall of the left frontal sinus with air-fluid level of the frontal sinus. The fracture extends to the left superior orbit and the superior medial orbital wall. No evidence of globe injury. Mild soft tissue swelling. The depressed fracture results in mild contour abnormality of the left frontal scalp soft tissues in the supraorbital region. No additional fracture identified. Fracture does not extend superiorly Sinuses/Orbits: No evidence of globe injury. Air-fluid level of the left frontal sinus. Other: None IMPRESSION: Acute comminuted slightly depressed fracture of the frontal wall of the left frontal sinus with associated small hemosinus. The fracture extends inferiorly to the superior medial orbit. No evidence of acute intracranial hemorrhage or globe injury. Intracranial atherosclerosis and mild white matter disease Electronically Signed   By: Corrie Mckusick D.O.   On: 10/24/2018 15:49   Mr Brain Wo Contrast  Result Date: 10/25/2018 CLINICAL DATA:  Initial evaluation for acute syncope. EXAM: MRI HEAD WITHOUT CONTRAST TECHNIQUE: Multiplanar, multiecho pulse sequences of the brain and surrounding structures were obtained without intravenous contrast. COMPARISON:  Prior CT from 10/24/2018 FINDINGS: Brain: Diffuse prominence of the CSF containing spaces compatible with generalized age-related cerebral atrophy. Patchy and confluent T2/FLAIR hyperintensity within the periventricular white matter most consistent with chronic small vessel ischemic disease, mild to moderate nature. Remote  lacunar infarcts seen involving the bilateral basal ganglia/corona radiata, left larger than right. Associated chronic hemosiderin staining about the chronic left basal ganglia infarct. Chronic microvascular ischemic changes noted within the pons as well. No mass lesion, midline shift, or mass effect. Mild ex vacuo dilatation of the left lateral ventricle related to the chronic left basal ganglia lacunar infarct. No hydrocephalus. No discernible extra-axial fluid collection. Pituitary gland suprasellar region normal. Midline structures intact. 6 mm focus of restricted diffusion involving the left posterior periventricular white matter/splenium compatible with an acute ischemic infarct. No associated hemorrhage or mass effect. No other abnormal foci of restricted diffusion to suggest acute or subacute ischemia. Gray-white matter differentiation otherwise maintained. No encephalomalacia to suggest chronic cortical infarction. No other evidence for acute or chronic intracranial hemorrhage. Vascular: Major intracranial vascular flow voids are well maintained. Skull and upper cervical spine: Craniocervical junction within normal limits. Upper cervical spine normal. Comminuted fracture involving the outer table of the left frontal sinus with associated hemosinus again noted, better seen on prior CT. Sinuses/Orbits: Patient status post bilateral ocular lens replacement. Scattered mucosal thickening noted within the ethmoidal air cells. Layering blood within the left frontal sinus. Trace bilateral mastoid effusions, of doubtful significance. Inner ear structures grossly normal. Other: None. IMPRESSION: 1. 6 mm acute ischemic nonhemorrhagic infarct involving the posterior left periventricular white matter/left splenium. 2. Comminuted fracture involving the outer table of the left frontal sinus with associated hemosinus, better seen on prior CT. 3. Chronic lacunar infarcts involving the bilateral basal ganglia/corona radiata,  left greater than right. Evidence for prior hemorrhage about the chronic left basal ganglia lacunar infarct. 4. Underlying age-related cerebral atrophy with moderate chronic microvascular ischemic disease. Electronically Signed   By: Jeannine Boga M.D.   On: 10/25/2018 03:58    [id number] except as listed in admit H&P  Blood pressure (!) 172/88, pulse 80, temperature 98.2 F (36.8 C), temperature source  Oral, resp. rate (!) 25, height 6\' 3"  (1.905 m), weight 78.8 kg, SpO2 100 %.  PHYSICAL EXAM: Overall appearance:  Healthy appearing, in no distress Head: Slight 3 cm depression of the left frontal region just above the left eyebrow, minimal ecchymosis, mild tenderness. Ears: External ears are normal. Nose: External nose is healthy in appearance. Internal nasal exam free of any lesions or obstruction. Oral Cavity/Pharynx:  There are no mucosal lesions or masses identified. Larynx/Hypopharynx: Deferred Neuro:  No identifiable neurologic deficits. Neck: No palpable neck masses.  Studies Reviewed: Head CT.  Procedures: none   Assessment/Plan: Moderately displaced anterior table frontal sinus fracture left side.  The sinus itself is mostly clear except for a small amount of material within the sinus that is likely blood from the injury.  The posterior wall is intact.  There is no evidence of brain injury.  The outflow tract of the sinus is also intact.  This is mainly a cosmetic issue.  We discussed that treatment of this would involve a surgical reduction with internal fixation.  A forehead crease incision would be used.  Microplating system could be used to reconstruct the anterior wall of the sinus.  This can be done anytime in the next 2 weeks or so.  This can be done while in the hospital or can be done as an outpatient if his clinical situation allows discharge from the hospital.  He can follow-up as an outpatient and he can let me know at that point if he is interested in  surgery.  Izora Gala 10/25/2018, 6:26 AM

## 2018-10-25 NOTE — Consult Note (Signed)
Renal Service Consult Note [facility]  Jesse Mcgee 10/25/2018 [place] Requesting Physician:  Dr Wynelle Cleveland  Reason for Consult: ESRD pt w/ hematemesis and syncope HPI: The patient is a 48 y.o. year-old w/ hx of HTN, HL, DM and ESRD on PD presenting w/ syncopal episode and hematemesis/ anemia.  Pt admitted. We are asked to see for ESRD.    Pt had syncopal episode after having a BM yesterday, hit his head w/ fall. No stroke signs on admission. After passing out pt stated he had 2 episodes of vomiting blood.  Hb 11 on admission, chem/ LFT's ok for esrd pt.  BP's were orthostatic in ED and he got NS bolus.  MRI today showed inicidental infarct. GI planning EGD for tomorrow. Hb ok.  Pt w/o complaints. Last PD set up was the night before last.       Pt today is w/o complaints.     ROS  denies CP  no joint pain   no HA  no blurry vision  no rash  no diarrhea  no nausea/ vomiting   Past Medical History  Past Medical History:  Diagnosis Date  . Depression   . Diabetes mellitus without complication ([place])   . ESRD on peritoneal dialysis ([place])   . HLD (hyperlipidemia)   . HTN (hypertension)   . Renal disorder    Past Surgical History  Past Surgical History:  Procedure Laterality Date  . EYE SURGERY     Family History  Family History  Problem Relation Age of Onset  . Diabetes Mellitus II Mother   . Diabetes Mellitus II Father   . Diabetes Mellitus II Sister   . Diabetes Mellitus II Brother    Social History  reports that he has never smoked. He has never used smokeless tobacco. He reports that he does not drink alcohol or use drugs. Allergies No Known Allergies Home medications Prior to Admission medications   Medication Sig Start Date End Date Taking? Authorizing Provider  atorvastatin (LIPITOR) 80 MG tablet Take 40 mg by mouth daily at 6 PM.  10/04/15  Yes [provider]  calcitRIOL (ROCALTROL) 0.25 MCG capsule Take 0.25 mcg by mouth See  admin instructions. MON thru [place] 07/12/18  Yes [provider]  carvedilol (COREG) 25 MG tablet Take 12.5 mg by mouth 2 (two) times daily with a meal.  09/26/18  Yes [provider]  escitalopram (LEXAPRO) 10 MG tablet Take 10 mg by mouth at bedtime.  03/06/18  Yes [provider]  furosemide (LASIX) 80 MG tablet Take 40 mg by mouth.   Yes [provider]  isosorbide mononitrate (IMDUR) 60 MG 24 hr tablet Take 60 mg by mouth daily.  09/26/18  Yes [provider]  metoCLOPramide (REGLAN) 5 MG tablet Take 5 mg by mouth 4 (four) times daily -  before meals and at bedtime.  10/01/18  Yes [provider]  omeprazole (PRILOSEC) 20 MG capsule Take 20 mg by mouth daily.   Yes [provider]  sevelamer carbonate (RENVELA) 800 MG tablet Take 800 mg by mouth 3 (three) times daily with meals.  03/13/18  Yes [provider]  zolpidem (AMBIEN) 10 MG tablet Take 5 mg by mouth at bedtime.  05/02/17  Yes [provider]   Liver Function Tests Recent Labs  Lab 10/24/18 1524  AST 18  ALT 15  ALKPHOS 47  BILITOT 0.8  PROT 6.5  ALBUMIN 3.1*   No results for input(s):  LIPASE, AMYLASE in the last 168 hours. CBC Recent Labs  Lab 10/24/18 1524 10/24/18 2137 10/25/18 0428 10/25/18 0751 10/25/18 1255  WBC 7.6 8.5 8.3 8.1  --   NEUTROABS 5.3  --   --   --   --   HGB 11.2* 10.9* 10.0* 10.2* 9.5*  HCT 34.4* 33.8* 30.6* 31.1* 29.1*  MCV 93.0 91.6 91.1 90.7  --   PLT 163 172 172 171  --    Basic Metabolic Panel Recent Labs  Lab 10/24/18 1524 10/25/18 0428  NA 133* 137  K 3.1* 3.6  CL 92* 94*  CO2 28 29  GLUCOSE 141* 90  BUN 43* 48*  CREATININE 11.40* 12.42*  CALCIUM 8.1* 8.2*   Iron/TIBC/Ferritin/ %Sat No results found for: IRON, TIBC, FERRITIN, IRONPCTSAT  Vitals:   10/24/18 1800 10/24/18 2220 10/24/18 2334 10/25/18 0343  BP: (!) 176/106 (!) 195/107 (!) 171/99 (!) 172/88  Pulse: 70 76 81 80  Resp: 12 14  (!) 25  Temp:   98.2 F (36.8 C) 98.3 F (36.8 C) 98.2 F (36.8 C)  TempSrc:  Oral Oral Oral  SpO2: 98% 100% 100% 100%  Weight:    78.8 kg  Height:       Exam Gen alert, thin , chron ill appearing, no distress No rash, cyanosis or gangrene Sclera anicteric, throat clear  No jvd or bruits Chest clear bilat no rales or wheezing RRR no MRG Abd soft ntnd no mass or ascites +bs +PD cath LLQ GU normal male MS no joint effusions or deformity Ext no LE edema, no wounds or ulcers, muscle wasting in LE's Neuro is alert, Ox 3 , nf    Home meds:  - carvedilol 12.5 bid/ isosorbide mononitrate 60 qd/ atorvastatin 40   - escitalopram 10 hs/ zolpidem 5 hs prn  - sevelamer carb tid ac/ prn reglan     CCPD 7d/ week: ([facility] in [place], [place], outside of [employer])  5 exchanges   2200 vol overnight, last fill 2000 Icodextrin  10 hrs   Dry wt 160- 165lbs        Assessment: 1. Syncope 2. Incidental CVA - by MRI today 3. Hematemesis - at home prior to admission per patient. For egd tomorrow.  4. ESRD on CCPD - got sig IVF"s overnight and wt's are up 173 lbs (165 dry wt). No edema on exam and no resp issues. Plan CCPD tonight w/ 2.5 % solutions.  5. HTN - bp's stable    Plan: 1. PD tonight.       [facility] 10/25/2018, 2:26 PM

## 2018-10-25 NOTE — Consult Note (Addendum)
[facility] Consult: 8:27 AM 10/25/2018  LOS: 1 day    Referring Provider: Dr Wynelle Cleveland  Primary Care Physician:  System, Pcp Not In Primary Gastroenterologist:  Dr Kinnie Feil in [place].  [phone], fax [phone].      Reason for Consultation:  GIB   HPI: Jesse Mcgee is a 48 y.o. male.  PMH ESRD on home PD.  CHF, EF 42% in 04/2017.  Non ischemic CM.  Iron def anemia treated in past with parenteral ferric carboxymaltose 11/2016 as well as procrit.  PRBC transfusion 09/2016.  Gastroparesis.  Ophthalmologic dz.  Diet controlled DM (insulin previously).   HTN.  Polyneuropathy.  Foot ulcer.  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  Previous but not current Plavix.  Hep B surface Ab, Hep B core Ab, Hep B surf Ag, Hep C Ab and qual all negative in 01/2017.   Colonoscopy 04/2016: path: ascending colon tubular adenoma .   EGD 04/2016. Path report.   Stomach biopsy: benign gastric mucosa, no H Pylori,  Duodenal bx: focal heterotropic gastric fundal mucosa, no sprue, dysplasia etc.   EGD 2018 "unremarkable" per notes.    Capsule endo cxld in 08/2016.    Home meds include the Plavix, Reglan 5 mg with meals, Prilosec 20 mg daily..    Patient has problems with postprandial vomiting which occurs 2 or 3 times a week.  It usually consists of partially digested food or nonbloody watery liquid.  He has early satiety.  Weight fluctuates by as much as 10 or 15 pounds within a few weeks but he has not lost significant weight recently.  He used to have trouble swallowing but after starting a medication, assume this is the Prilosec, he has not had dysphagia.  Yesterday morning had syncope after standing up from commode after a BM.  Sustained facial trauma.  He never looked at the stool to see what color it  is.  But he has not noticed any bloody or melenic stool.  Normally has bowel movements daily or every other day. Than 20 minutes of the syncopal spell he tried to eat and vomited "purple" liquid that looked bloody.  He had another episode at home and 2 additional episodes after arriving at [facility] and then again after admission to [facility].  This morning he vomited but it was clear.  Denies abdominal or chest pain.  No sweats, chills, dyspnea.  Sustained fractures of frontal sinus extending to superior medial orbit.  Incidentally on head CT: intracranial atherosclerosis, white matter dz.   Head MRI with ischemic non-hemmorhagic Left infarct.  Chronic lacunar infarcts.   Hgb 11.2 >> 10.2.  MCV 90.  Platelets, coags normal. Hgb of 01/2017: 9.5.   Trops 0.13 >> 0.12.   LFTS normal.     Neurosurgery contemplating non-emergent, inpt repair of facial fx.   Neuro, stroke work-up in progress.  Past Medical History:  Diagnosis Date   Depression    Diabetes mellitus without complication ([place])    ESRD on peritoneal dialysis ([place])  HLD (hyperlipidemia)    HTN (hypertension)    Renal disorder     Past Surgical History:  Procedure Laterality Date   EYE SURGERY     This med list is not accurate Prior to Admission medications   Medication Sig Start Date End Date Taking? Authorizing Provider  amLODipine (NORVASC) 5 MG tablet Take by mouth. 10/20/15  Yes [provider]  atorvastatin (LIPITOR) 80 MG tablet Take by mouth. 10/04/15  Yes [provider]  calcitRIOL (ROCALTROL) 0.25 MCG capsule  07/12/18  Yes [provider]  carvedilol (COREG) 25 MG tablet Take by mouth. 09/26/18  Yes [provider]  clopidogrel (PLAVIX) 75 MG tablet TAKE 1 TABLET BY MOUTH DAILY FOR 30 DAYS. START DATE: 07/26/17 STOP DATE: 07/29/17 **PAGED MD TO CLARIFY DATES** 07/10/17  Yes [provider]  escitalopram (LEXAPRO) 10 MG tablet  03/06/18  Yes [provider]  hydrALAZINE (APRESOLINE) 100 MG tablet Take by mouth.   Yes [provider]  isosorbide mononitrate (IMDUR) 30 MG 24 hr tablet Take by mouth. 09/26/18  Yes [provider]  isosorbide mononitrate (IMDUR) 60 MG 24 hr tablet Take by mouth. 11/14/16  Yes [provider]  metoCLOPramide (REGLAN) 5 MG tablet  10/01/18  Yes [provider]  mupirocin ointment (BACTROBAN) 2 % Apply topically. 01/10/17  Yes [provider]  sevelamer carbonate (RENVELA) 800 MG tablet Take by mouth. 03/13/18  Yes [provider]  silver sulfADIAZINE (SILVADENE) 1 % cream Apply topically. 09/11/18  Yes [provider]  zolpidem (AMBIEN) 10 MG tablet Take by mouth. 05/02/17  Yes [provider]    Scheduled Meds:   stroke: mapping our early stages of recovery book   Does not apply Once   amLODipine  5 mg Oral Daily   atorvastatin  80 mg Oral q1800   calcitRIOL  0.25 mcg Oral Daily   carvedilol  12.5 mg Oral BID WC   escitalopram  10 mg Oral Daily   hydrALAZINE  100 mg Oral BID   isosorbide mononitrate  60 mg Oral Daily   pantoprazole (PROTONIX) IV  40 mg Intravenous Q12H   sevelamer carbonate  800 mg Oral TID WC   Infusions:  sodium chloride 50 mL/hr at 10/24/18 2343   PRN Meds: acetaminophen, hydrALAZINE, morphine injection, nitroGLYCERIN, ondansetron **OR** ondansetron (ZOFRAN) IV, oxyCODONE-acetaminophen, zolpidem   Allergies as of 10/24/2018   (No Known Allergies)    Family History  Problem Relation Age of Onset   Diabetes Mellitus II Mother    Diabetes Mellitus II Father    Diabetes Mellitus II Sister    Diabetes Mellitus II Brother     Social History   Socioeconomic History   Marital status: Married    Spouse name: Not on file   Number of children: Not on file   Years of education: Not on file   Highest education level: Not on file  Occupational History   Not on file  Social Needs    Financial resource strain: Not on file   Food insecurity:    Worry: Not on file    Inability: Not on file   Transportation needs:    Medical: Not on file    Non-medical: Not on file  Tobacco Use   Smoking status: Never Smoker   Smokeless tobacco: Never Used  Substance and Sexual Activity   Alcohol use: Never    Frequency: Never   Drug use: Never   Sexual activity: Not on file  Lifestyle   Physical activity:    Days per week: Not on file    Minutes per session: Not on file   Stress: Not on file  Relationships   Social connections:    Talks on phone: Not on file    Gets together: Not on file    Attends religious service: Not on file    Active member of club or organization: Not on file    Attends meetings of clubs or organizations: Not on file    Relationship status: Not on file   Intimate partner violence:    Fear of current or ex partner: Not on file    Emotionally abused: Not on file    Physically abused: Not on file    Forced sexual activity: Not on file  Other Topics Concern   Not on file  Social History Narrative   Not on file    REVIEW OF SYSTEMS: Constitutional: Generally he has good energy but yesterday felt weak and dizzy after the syncope. ENT:  No nose bleeds Pulm: No shortness of breath or cough. CV:  No palpitations, no LE edema.  Chest pain. GU:  No hematuria, no frequency GI: Per HPI. Heme: The only place he has abnormal bleeding is small amounts of blood coming from the ulcer on the plantar surface of left foot.  This is not vigorous bleeding. Transfusions: See HPI. Neuro: Syncope Derm: Chronic left plantar foot ulcer. Endocrine:  No sweats or chills.  No polyuria or dysuria Immunization: Not queried.  Patient is not aware of what he has been vaccinated for Travel:  None beyond local counties in last few months.    PHYSICAL EXAM: Vital signs in last 24 hours: Vitals:   10/24/18 2334 10/25/18 0343  BP: (!) 171/99 (!) 172/88    Pulse: 81 80  Resp:  (!) 25  Temp: 98.3 F (36.8 C) 98.2 F (36.8 C)  SpO2: 100% 100%   Wt Readings from Last 3 Encounters:  10/25/18 78.8 kg    General: Does not, alert, comfortable.  Does not look acutely or chronically ill but appears older than stated age. Head: Facial asymmetry.  Slight depression in the left frontal region above the eyebrow with minor ecchymosis. Eyes: No scleral icterus, no conjunctival pallor.  EOMI. Ears: Not hard of hearing Nose: No congestion or discharge. Mouth: Oropharynx moist, pink, clear.  Tongue midline.  Many teeth are missing. Neck: No JVD, no masses, no thyromegaly. Lungs: Clear bilaterally.  No labored breathing or cough. Heart: RRR.  No MRG. Abdomen: Soft.  Not distended.  Active bowel sounds no masses, HSM, bruits, hernias.  The only tenderness, mild, is above the region of the PD catheter on the left.   Rectal: Deferred Musc/Skeltl: No joint redness swelling or contracture deformities Extremities: No CCE.  Left foot wrapped with gauze, bandage removed for exam Neurologic: Oriented x3.  Moves all 4 limbs.  No tremors. Skin: No rash or sores. Tattoos: None Nodes: No cervical adenopathy. Psych: Pleasant, calm, cooperative.  Intake/Output from previous day: 04/30 0701 - 05/01 0700 In: 100 [P.O.:100] Out: -  Intake/Output this shift: No intake/output data recorded.  LAB RESULTS: Recent Labs    10/24/18 2137 10/25/18 0428 10/25/18 0751  WBC 8.5 8.3 8.1  HGB 10.9* 10.0* 10.2*  HCT 33.8* 30.6* 31.1*  PLT 172 172 171   BMET Lab Results  Component Value Date   NA 137 10/25/2018   NA 133 (L) 10/24/2018   K 3.6 10/25/2018  K 3.1 (L) 10/24/2018   CL 94 (L) 10/25/2018   CL 92 (L) 10/24/2018   CO2 29 10/25/2018   CO2 28 10/24/2018   GLUCOSE 90 10/25/2018   GLUCOSE 141 (H) 10/24/2018   BUN 48 (H) 10/25/2018   BUN 43 (H) 10/24/2018   CREATININE 12.42 (H) 10/25/2018   CREATININE 11.40 (H) 10/24/2018   CALCIUM 8.2 (L)  10/25/2018   CALCIUM 8.1 (L) 10/24/2018   LFT Recent Labs    10/24/18 1524  PROT 6.5  ALBUMIN 3.1*  AST 18  ALT 15  ALKPHOS 47  BILITOT 0.8  BILIDIR 0.2  IBILI 0.6   PT/INR Lab Results  Component Value Date   INR 1.1 10/24/2018   Hepatitis Panel No results for input(s): HEPBSAG, HCVAB, HEPAIGM, HEPBIGM in the last 72 hours. C-Diff No components found for: CDIFF Lipase  No results found for: LIPASE  Drugs of Abuse  No results found for: LABOPIA, COCAINSCRNUR, LABBENZ, AMPHETMU, THCU, LABBARB   RADIOLOGY STUDIES: Ct Head Wo Contrast  Result Date: 10/24/2018 CLINICAL DATA:  48 year old male with syncope EXAM: CT HEAD WITHOUT CONTRAST TECHNIQUE: Contiguous axial images were obtained from the base of the skull through the vertex without intravenous contrast. COMPARISON:  None. FINDINGS: Brain: No acute intracranial hemorrhage. No midline shift or mass effect. Gray-white differentiation maintained. Patchy hypodensity in the right frontal white matter overlying the ventricle. Unremarkable appearance of the ventricular system. Vascular: Intracranial atherosclerosis Skull: Acute comminuted slightly depressed fracture of the frontal wall of the left frontal sinus with air-fluid level of the frontal sinus. The fracture extends to the left superior orbit and the superior medial orbital wall. No evidence of globe injury. Mild soft tissue swelling. The depressed fracture results in mild contour abnormality of the left frontal scalp soft tissues in the supraorbital region. No additional fracture identified. Fracture does not extend superiorly Sinuses/Orbits: No evidence of globe injury. Air-fluid level of the left frontal sinus. Other: None IMPRESSION: Acute comminuted slightly depressed fracture of the frontal wall of the left frontal sinus with associated small hemosinus. The fracture extends inferiorly to the superior medial orbit. No evidence of acute intracranial hemorrhage or globe injury.  Intracranial atherosclerosis and mild white matter disease Electronically Signed   By: Corrie Mckusick D.O.   On: 10/24/2018 15:49   Mr Brain Wo Contrast  Result Date: 10/25/2018 CLINICAL DATA:  Initial evaluation for acute syncope. EXAM: MRI HEAD WITHOUT CONTRAST TECHNIQUE: Multiplanar, multiecho pulse sequences of the brain and surrounding structures were obtained without intravenous contrast. COMPARISON:  Prior CT from 10/24/2018 FINDINGS: Brain: Diffuse prominence of the CSF containing spaces compatible with generalized age-related cerebral atrophy. Patchy and confluent T2/FLAIR hyperintensity within the periventricular white matter most consistent with chronic small vessel ischemic disease, mild to moderate nature. Remote lacunar infarcts seen involving the bilateral basal ganglia/corona radiata, left larger than right. Associated chronic hemosiderin staining about the chronic left basal ganglia infarct. Chronic microvascular ischemic changes noted within the pons as well. No mass lesion, midline shift, or mass effect. Mild ex vacuo dilatation of the left lateral ventricle related to the chronic left basal ganglia lacunar infarct. No hydrocephalus. No discernible extra-axial fluid collection. Pituitary gland suprasellar region normal. Midline structures intact. 6 mm focus of restricted diffusion involving the left posterior periventricular white matter/splenium compatible with an acute ischemic infarct. No associated hemorrhage or mass effect. No other abnormal foci of restricted diffusion to suggest acute or subacute ischemia. Gray-white matter differentiation otherwise maintained. No encephalomalacia to suggest chronic  cortical infarction. No other evidence for acute or chronic intracranial hemorrhage. Vascular: Major intracranial vascular flow voids are well maintained. Skull and upper cervical spine: Craniocervical junction within normal limits. Upper cervical spine normal. Comminuted fracture involving  the outer table of the left frontal sinus with associated hemosinus again noted, better seen on prior CT. Sinuses/Orbits: Patient status post bilateral ocular lens replacement. Scattered mucosal thickening noted within the ethmoidal air cells. Layering blood within the left frontal sinus. Trace bilateral mastoid effusions, of doubtful significance. Inner ear structures grossly normal. Other: None. IMPRESSION: 1. 6 mm acute ischemic nonhemorrhagic infarct involving the posterior left periventricular white matter/left splenium. 2. Comminuted fracture involving the outer table of the left frontal sinus with associated hemosinus, better seen on prior CT. 3. Chronic lacunar infarcts involving the bilateral basal ganglia/corona radiata, left greater than right. Evidence for prior hemorrhage about the chronic left basal ganglia lacunar infarct. 4. Underlying age-related cerebral atrophy with moderate chronic microvascular ischemic disease. Electronically Signed   By: Jeannine Boga M.D.   On: 10/25/2018 03:58     IMPRESSION:   *  Hematemesis.  R/o ulcer, r/o MWT.   presumed dx gastroparesis.  Chronic non-bloody N/V and early satiety on Reglan AC.    *   Hx IDA.   Previous parenteral iron and procrit but not recently.   Work ups in 2017 with EGD, colonoscopy: path only in records.  Had ademomatous colon polyp.  2018 EGD?, mentioned in notes but no actual records.   *   Facial fracture post syncope related fall.    *    Ischemic, non-hemorrhagic CVA.  Older, chronic lacunar infarcts.    *    ESRD.  On nightly PD.  *    ASPVD.      PLAN:     *    EGD tomorrow.  IV BID Protonix .   Clears today.Azucena Freed  10/25/2018, 8:27 AM Phone [phone]   Attending physician's note   I have taken a history, examined the patient and reviewed the chart. I agree with the Advanced Practitioner's note, impression and recommendations.  67 yr M with h/o ESRD on PD, CHF, DM, gastroparesis admitted  after syncope, fall with fracture of frontal sinus, he had few episodes of hematemesis Hgb 11-10 No further episode of hematemesis this AM, hemodynamically stable Continue ASA 81mg  daily He was on Plavix at home, not clear if patient was taking it. Currently on hold Plan for EGD tomorrow AM  The risks and benefits as well as alternatives of endoscopic procedure(s) have been discussed and reviewed. All questions answered. The patient agrees to proceed.     Damaris Hippo , MD [phone]

## 2018-10-25 NOTE — Progress Notes (Signed)
STROKE TEAM PROGRESS NOTE   INTERVAL HISTORY I have personally reviewed history of presenting illness with the patient.  Patient states he had a syncopal episode after he had use the restroom and fell and hit his left forehead and sustained a nondisplaced fracture.  MRI shows incidental tiny lacunar infarct.  He has no focal neurological symptoms to correlate with that infarct. Vitals:   10/24/18 1800 10/24/18 2220 10/24/18 2334 10/25/18 0343  BP: (!) 176/106 (!) 195/107 (!) 171/99 (!) 172/88  Pulse: 70 76 81 80  Resp: 12 14  (!) 25  Temp:  98.2 F (36.8 C) 98.3 F (36.8 C) 98.2 F (36.8 C)  TempSrc:  Oral Oral Oral  SpO2: 98% 100% 100% 100%  Weight:    78.8 kg  Height:        CBC:  Recent Labs  Lab 10/24/18 1524  10/25/18 0428 10/25/18 0751  WBC 7.6   < > 8.3 8.1  NEUTROABS 5.3  --   --   --   HGB 11.2*   < > 10.0* 10.2*  HCT 34.4*   < > 30.6* 31.1*  MCV 93.0   < > 91.1 90.7  PLT 163   < > 172 171   < > = values in this interval not displayed.    Basic Metabolic Panel:  Recent Labs  Lab 10/24/18 1524 10/25/18 0428  NA 133* 137  K 3.1* 3.6  CL 92* 94*  CO2 28 29  GLUCOSE 141* 90  BUN 43* 48*  CREATININE 11.40* 12.42*  CALCIUM 8.1* 8.2*   Lipid Panel:     Component Value Date/Time   CHOL 132 10/25/2018 0428   TRIG 74 10/25/2018 0428   HDL 37 (L) 10/25/2018 0428   CHOLHDL 3.6 10/25/2018 0428   VLDL 15 10/25/2018 0428   LDLCALC 80 10/25/2018 0428   HgbA1c:  Lab Results  Component Value Date   HGBA1C 7.5 (H) 10/25/2018   Urine Drug Screen: No results found for: LABOPIA, COCAINSCRNUR, LABBENZ, AMPHETMU, THCU, LABBARB  Alcohol Level No results found for: ETH  IMAGING Ct Head Wo Contrast  Result Date: 10/24/2018 CLINICAL DATA:  48 year old male with syncope EXAM: CT HEAD WITHOUT CONTRAST TECHNIQUE: Contiguous axial images were obtained from the base of the skull through the vertex without intravenous contrast. COMPARISON:  None. FINDINGS: Brain: No acute  intracranial hemorrhage. No midline shift or mass effect. Gray-white differentiation maintained. Patchy hypodensity in the right frontal white matter overlying the ventricle. Unremarkable appearance of the ventricular system. Vascular: Intracranial atherosclerosis Skull: Acute comminuted slightly depressed fracture of the frontal wall of the left frontal sinus with air-fluid level of the frontal sinus. The fracture extends to the left superior orbit and the superior medial orbital wall. No evidence of globe injury. Mild soft tissue swelling. The depressed fracture results in mild contour abnormality of the left frontal scalp soft tissues in the supraorbital region. No additional fracture identified. Fracture does not extend superiorly Sinuses/Orbits: No evidence of globe injury. Air-fluid level of the left frontal sinus. Other: None IMPRESSION: Acute comminuted slightly depressed fracture of the frontal wall of the left frontal sinus with associated small hemosinus. The fracture extends inferiorly to the superior medial orbit. No evidence of acute intracranial hemorrhage or globe injury. Intracranial atherosclerosis and mild white matter disease Electronically Signed   By: Corrie Mckusick D.O.   On: 10/24/2018 15:49   Mr Brain Wo Contrast  Result Date: 10/25/2018 CLINICAL DATA:  Initial evaluation for acute syncope. EXAM:  MRI HEAD WITHOUT CONTRAST TECHNIQUE: Multiplanar, multiecho pulse sequences of the brain and surrounding structures were obtained without intravenous contrast. COMPARISON:  Prior CT from 10/24/2018 FINDINGS: Brain: Diffuse prominence of the CSF containing spaces compatible with generalized age-related cerebral atrophy. Patchy and confluent T2/FLAIR hyperintensity within the periventricular white matter most consistent with chronic small vessel ischemic disease, mild to moderate nature. Remote lacunar infarcts seen involving the bilateral basal ganglia/corona radiata, left larger than right.  Associated chronic hemosiderin staining about the chronic left basal ganglia infarct. Chronic microvascular ischemic changes noted within the pons as well. No mass lesion, midline shift, or mass effect. Mild ex vacuo dilatation of the left lateral ventricle related to the chronic left basal ganglia lacunar infarct. No hydrocephalus. No discernible extra-axial fluid collection. Pituitary gland suprasellar region normal. Midline structures intact. 6 mm focus of restricted diffusion involving the left posterior periventricular white matter/splenium compatible with an acute ischemic infarct. No associated hemorrhage or mass effect. No other abnormal foci of restricted diffusion to suggest acute or subacute ischemia. Gray-white matter differentiation otherwise maintained. No encephalomalacia to suggest chronic cortical infarction. No other evidence for acute or chronic intracranial hemorrhage. Vascular: Major intracranial vascular flow voids are well maintained. Skull and upper cervical spine: Craniocervical junction within normal limits. Upper cervical spine normal. Comminuted fracture involving the outer table of the left frontal sinus with associated hemosinus again noted, better seen on prior CT. Sinuses/Orbits: Patient status post bilateral ocular lens replacement. Scattered mucosal thickening noted within the ethmoidal air cells. Layering blood within the left frontal sinus. Trace bilateral mastoid effusions, of doubtful significance. Inner ear structures grossly normal. Other: None. IMPRESSION: 1. 6 mm acute ischemic nonhemorrhagic infarct involving the posterior left periventricular white matter/left splenium. 2. Comminuted fracture involving the outer table of the left frontal sinus with associated hemosinus, better seen on prior CT. 3. Chronic lacunar infarcts involving the bilateral basal ganglia/corona radiata, left greater than right. Evidence for prior hemorrhage about the chronic left basal ganglia lacunar  infarct. 4. Underlying age-related cerebral atrophy with moderate chronic microvascular ischemic disease. Electronically Signed   By: Jeannine Boga M.D.   On: 10/25/2018 03:58    PHYSICAL EXAM Pleasant middle-age male currently not in distress..  He has slight deformity of the left supraorbital region from recent fall and injury . Afebrile. Head is nontraumatic. Neck is supple without bruit.    Cardiac exam no murmur or gallop. Lungs are clear to auscultation. Distal pulses are well felt. Neurological Exam ;  Awake  Alert oriented x 3. Normal speech and language.eye movements full without nystagmus.fundi were not visualized. Vision acuity and fields appear normal. Hearing is normal. Palatal movements are normal. Face symmetric. Tongue midline. Normal strength, tone, reflexes and coordination. Normal sensation. Gait deferred.  ASSESSMENT/PLAN Jesse Mcgee is a 48 y.o. 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   Stroke:  Incidental left periventricular white matter infarct secondary to small vessel disease source  CT head L frontal sinus wall fracture extending into superior medial orbid w/ small hemosinus. No acute stroke. Small vessel disease.   MRI  L posterior PVWM/splenium infarct. comminuted fx outer L frontal sinus w/ hemosinus. Old L>R B BG/CR infarct. Old L BG hemorrhage. Small vessel disease. Atrophy.   Carotid Doppler  Pending  2D Echo  pending  Transcranial doppler pending  LDL 80  HgbA1c 7.5  SCDs for VTE  prophylaxis  clopidogrel 75 mg daily prior to admission, now on No antithrombotic given hematemesis just prior to arrival. [facility] to add aspirin 81 mg daily and continue at d/c  Therapy recommendations:  pending  Disposition:  pending   Hypertensive Emergency  BP as high as 206/117  Now Stable 170s . Permissive hypertension (OK  if < 220/120) but gradually normalize in 5-7 days . Long-term BP goal normotensive  Hyperlipidemia  Home meds:  lipitor 80, resumed in hospital  LDL 80, goal < 70  Continue statin at discharge  Diabetes type II Uncontrolled  HgbA1c 7.5, goal < 7.0  Other Stroke Risk Factors  + troponin, no CP. Trending.   Other Active Problems  Syncope w/ fall and LOC, possible related to valsalva during BM, orthostasis  L frontal sinus fx from fall, likely etiology of hematemesis. Candidate for cosmetic correction surgery within next 2 weeks if pt interested, either as IP or OP  ESRD on peritoneal dialysis   Hospital day # 1 I have personally obtained history,examined this patient, reviewed notes, independently viewed imaging studies, participated in medical decision making and plan of care.ROS completed by me personally and pertinent positives fully documented  I have made any additions or clarifications directly to the above note.  He presented with brief syncopal episode following micturition without any focal neurological symptoms but MRI shows a tiny incidental lacunar infarct likely from small vessel disease and hypertension related to syncope.  Recommend aspirin 81 mg daily alone and aggressive risk factor modification.  Check echocardiogram, carotid and transcranial Doppler studies.  Long discussion with the patient regarding his silent lacunar infarct and recurrent stroke risk and stroke prevention and answering questions.  Discussed with Dr. Wynelle Cleveland.  Greater than 50% time during this 35-minute visit was spent on counseling and coordination of care about his lacunar infarct and stroke risk and discussion about stroke prevention and treatment and answering questions.  Antony Contras, MD Medical Director [place] Pager: [phone] 10/25/2018 12:36 PM   To contact Stroke Continuity provider, please refer to [email or web address] After hours, contact General Neurology

## 2018-10-25 NOTE — Evaluation (Signed)
Physical Therapy Evaluation Patient Details Name: Jesse Mcgee MRN: [id number] DOB: 1970/08/04 Today's Date: 10/25/2018   History of Present Illness  Jesse Mcgee  is a 48 y.o. male with medical history significant of ESRD-on nightly peritoneal dialysis, hypertension, hyperlipidemia, depression, diet-controlled diabetes, who presents with syncope and hematemesis.    Clinical Impression  Pt admitted with above diagnosis. Pt currently with functional limitations due to the deficits listed below (see PT Problem List). PTA, pt living at home with wife, reports no falls or issues before admission. Today, ambulating room without assistive device, he reports feeling weak from laying in bed. No focal weakness, coordination sensation in tact.  Pt will benefit from skilled PT to increase their independence and safety with mobility to allow discharge to the venue listed below.    BP seated 90/70 BP standing 91/73   * no dizziness with standing     Follow Up Recommendations Home health PT;Supervision/Assistance - 24 hour    Equipment Recommendations  None recommended by PT    Recommendations for Other Services       Precautions / Restrictions Precautions Precautions: Fall Restrictions Weight Bearing Restrictions: No      Mobility  Bed Mobility Overal bed mobility: Modified Independent                Transfers Overall transfer level: Modified independent Equipment used: None             General transfer comment: stood, no drop in BP although patient states his legs are weak and he has to sit back down  Ambulation/Gait Ambulation/Gait assistance: Supervision Gait Distance (Feet): 20 Feet Assistive device: None Gait Pattern/deviations: Step-to pattern Gait velocity: decreased   General Gait Details: walking to door and back several times.   Stairs            Wheelchair Mobility    Modified Rankin (Stroke Patients Only)       Balance Overall  balance assessment: Needs assistance   Sitting balance-Leahy Scale: Good       Standing balance-Leahy Scale: Fair                               Pertinent Vitals/Pain Pain Assessment: Faces Faces Pain Scale: Hurts little more Pain Location: face Pain Descriptors / Indicators: Aching    Home Living Family/patient expects to be discharged to:: Private residence Living Arrangements: Spouse/significant other Available Help at Discharge: Family;Available 24 hours/day Type of Home: House Home Access: Level entry     Home Layout: One level Home Equipment: Walker - 2 wheels;Cane - single point      Prior Function Level of Independence: Independent               Hand Dominance        Extremity/Trunk Assessment   Upper Extremity Assessment Upper Extremity Assessment: Overall WFL for tasks assessed    Lower Extremity Assessment Lower Extremity Assessment: Overall WFL for tasks assessed       Communication      Cognition Arousal/Alertness: Awake/alert                                            General Comments      Exercises     Assessment/Plan    PT Assessment Patient needs continued PT services  PT Problem List  Decreased strength       PT Treatment Interventions DME instruction;Gait training;Stair training;Functional mobility training;Therapeutic activities;Therapeutic exercise    PT Goals (Current goals can be found in the Care Plan section)  Acute Rehab PT Goals Patient Stated Goal: go home PT Goal Formulation: With patient Time For Goal Achievement: 11/08/18 Potential to Achieve Goals: Good    Frequency Min 3X/week   Barriers to discharge        Co-evaluation               AM-PAC PT "6 Clicks" Mobility  Outcome Measure Help needed turning from your back to your side while in a flat bed without using bedrails?: None Help needed moving from lying on your back to sitting on the side of a flat bed  without using bedrails?: A Little Help needed moving to and from a bed to a chair (including a wheelchair)?: A Little Help needed standing up from a chair using your arms (e.g., wheelchair or bedside chair)?: A Little Help needed to walk in hospital room?: A Little Help needed climbing 3-5 steps with a railing? : A Lot 6 Click Score: 18    End of Session Equipment Utilized During Treatment: Gait belt Activity Tolerance: Patient tolerated treatment well Patient left: in bed;with call bell/phone within reach Nurse Communication: Mobility status PT Visit Diagnosis: Unsteadiness on feet (R26.81)    Time: 2951-8841 PT Time Calculation (min) (ACUTE ONLY): 29 min   Charges:   PT Evaluation $PT Eval Low Complexity: 1 Low PT Treatments $Gait Training: 8-22 mins      Reinaldo Berber, PT, DPT Acute Rehabilitation Services Pager: [phone] Office: [place] 10/25/2018, 4:06 PM

## 2018-10-25 NOTE — H&P (View-Only) (Signed)
[facility] Consult: 8:27 AM 10/25/2018  LOS: 1 day    Referring Provider: Dr Wynelle Cleveland  Primary Care Physician:  System, Pcp Not In Primary Gastroenterologist:  Dr Kinnie Feil in [place].  [phone], fax [phone].      Reason for Consultation:  GIB   HPI: Jesse Mcgee is a 48 y.o. male.  PMH ESRD on home PD.  CHF, EF 42% in 04/2017.  Non ischemic CM.  Iron def anemia treated in past with parenteral ferric carboxymaltose 11/2016 as well as procrit.  PRBC transfusion 09/2016.  Gastroparesis.  Ophthalmologic dz.  Diet controlled DM (insulin previously).   HTN.  Polyneuropathy.  Foot ulcer.  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  Previous but not current Plavix.  Hep B surface Ab, Hep B core Ab, Hep B surf Ag, Hep C Ab and qual all negative in 01/2017.   Colonoscopy 04/2016: path: ascending colon tubular adenoma .   EGD 04/2016. Path report.   Stomach biopsy: benign gastric mucosa, no H Pylori,  Duodenal bx: focal heterotropic gastric fundal mucosa, no sprue, dysplasia etc.   EGD 2018 "unremarkable" per notes.    Capsule endo cxld in 08/2016.    Home meds include the Plavix, Reglan 5 mg with meals, Prilosec 20 mg daily..    Patient has problems with postprandial vomiting which occurs 2 or 3 times a week.  It usually consists of partially digested food or nonbloody watery liquid.  He has early satiety.  Weight fluctuates by as much as 10 or 15 pounds within a few weeks but he has not lost significant weight recently.  He used to have trouble swallowing but after starting a medication, assume this is the Prilosec, he has not had dysphagia.  Yesterday morning had syncope after standing up from commode after a BM.  Sustained facial trauma.  He never looked at the stool to see what color it  is.  But he has not noticed any bloody or melenic stool.  Normally has bowel movements daily or every other day. Than 20 minutes of the syncopal spell he tried to eat and vomited "purple" liquid that looked bloody.  He had another episode at home and 2 additional episodes after arriving at [facility] and then again after admission to [facility].  This morning he vomited but it was clear.  Denies abdominal or chest pain.  No sweats, chills, dyspnea.  Sustained fractures of frontal sinus extending to superior medial orbit.  Incidentally on head CT: intracranial atherosclerosis, white matter dz.   Head MRI with ischemic non-hemmorhagic Left infarct.  Chronic lacunar infarcts.   Hgb 11.2 >> 10.2.  MCV 90.  Platelets, coags normal. Hgb of 01/2017: 9.5.   Trops 0.13 >> 0.12.   LFTS normal.     Neurosurgery contemplating non-emergent, inpt repair of facial fx.   Neuro, stroke work-up in progress.  Past Medical History:  Diagnosis Date   Depression    Diabetes mellitus without complication ([place])    ESRD on peritoneal dialysis ([place])  HLD (hyperlipidemia)    HTN (hypertension)    Renal disorder     Past Surgical History:  Procedure Laterality Date   EYE SURGERY     This med list is not accurate Prior to Admission medications   Medication Sig Start Date End Date Taking? Authorizing Provider  amLODipine (NORVASC) 5 MG tablet Take by mouth. 10/20/15  Yes [provider]  atorvastatin (LIPITOR) 80 MG tablet Take by mouth. 10/04/15  Yes [provider]  calcitRIOL (ROCALTROL) 0.25 MCG capsule  07/12/18  Yes [provider]  carvedilol (COREG) 25 MG tablet Take by mouth. 09/26/18  Yes [provider]  clopidogrel (PLAVIX) 75 MG tablet TAKE 1 TABLET BY MOUTH DAILY FOR 30 DAYS. START DATE: 07/26/17 STOP DATE: 07/29/17 **PAGED MD TO CLARIFY DATES** 07/10/17  Yes [provider]  escitalopram (LEXAPRO) 10 MG tablet  03/06/18  Yes [provider]  hydrALAZINE (APRESOLINE) 100 MG tablet Take by mouth.   Yes [provider]  isosorbide mononitrate (IMDUR) 30 MG 24 hr tablet Take by mouth. 09/26/18  Yes [provider]  isosorbide mononitrate (IMDUR) 60 MG 24 hr tablet Take by mouth. 11/14/16  Yes [provider]  metoCLOPramide (REGLAN) 5 MG tablet  10/01/18  Yes [provider]  mupirocin ointment (BACTROBAN) 2 % Apply topically. 01/10/17  Yes [provider]  sevelamer carbonate (RENVELA) 800 MG tablet Take by mouth. 03/13/18  Yes [provider]  silver sulfADIAZINE (SILVADENE) 1 % cream Apply topically. 09/11/18  Yes [provider]  zolpidem (AMBIEN) 10 MG tablet Take by mouth. 05/02/17  Yes [provider]    Scheduled Meds:   stroke: mapping our early stages of recovery book   Does not apply Once   amLODipine  5 mg Oral Daily   atorvastatin  80 mg Oral q1800   calcitRIOL  0.25 mcg Oral Daily   carvedilol  12.5 mg Oral BID WC   escitalopram  10 mg Oral Daily   hydrALAZINE  100 mg Oral BID   isosorbide mononitrate  60 mg Oral Daily   pantoprazole (PROTONIX) IV  40 mg Intravenous Q12H   sevelamer carbonate  800 mg Oral TID WC   Infusions:  sodium chloride 50 mL/hr at 10/24/18 2343   PRN Meds: acetaminophen, hydrALAZINE, morphine injection, nitroGLYCERIN, ondansetron **OR** ondansetron (ZOFRAN) IV, oxyCODONE-acetaminophen, zolpidem   Allergies as of 10/24/2018   (No Known Allergies)    Family History  Problem Relation Age of Onset   Diabetes Mellitus II Mother    Diabetes Mellitus II Father    Diabetes Mellitus II Sister    Diabetes Mellitus II Brother     Social History   Socioeconomic History   Marital status: Married    Spouse name: Not on file   Number of children: Not on file   Years of education: Not on file   Highest education level: Not on file  Occupational History   Not on file  Social Needs    Financial resource strain: Not on file   Food insecurity:    Worry: Not on file    Inability: Not on file   Transportation needs:    Medical: Not on file    Non-medical: Not on file  Tobacco Use   Smoking status: Never Smoker   Smokeless tobacco: Never Used  Substance and Sexual Activity   Alcohol use: Never    Frequency: Never   Drug use: Never   Sexual activity: Not on file  Lifestyle   Physical activity:    Days per week: Not on file    Minutes per session: Not on file   Stress: Not on file  Relationships   Social connections:    Talks on phone: Not on file    Gets together: Not on file    Attends religious service: Not on file    Active member of club or organization: Not on file    Attends meetings of clubs or organizations: Not on file    Relationship status: Not on file   Intimate partner violence:    Fear of current or ex partner: Not on file    Emotionally abused: Not on file    Physically abused: Not on file    Forced sexual activity: Not on file  Other Topics Concern   Not on file  Social History Narrative   Not on file    REVIEW OF SYSTEMS: Constitutional: Generally he has good energy but yesterday felt weak and dizzy after the syncope. ENT:  No nose bleeds Pulm: No shortness of breath or cough. CV:  No palpitations, no LE edema.  Chest pain. GU:  No hematuria, no frequency GI: Per HPI. Heme: The only place he has abnormal bleeding is small amounts of blood coming from the ulcer on the plantar surface of left foot.  This is not vigorous bleeding. Transfusions: See HPI. Neuro: Syncope Derm: Chronic left plantar foot ulcer. Endocrine:  No sweats or chills.  No polyuria or dysuria Immunization: Not queried.  Patient is not aware of what he has been vaccinated for Travel:  None beyond local counties in last few months.    PHYSICAL EXAM: Vital signs in last 24 hours: Vitals:   10/24/18 2334 10/25/18 0343  BP: (!) 171/99 (!) 172/88    Pulse: 81 80  Resp:  (!) 25  Temp: 98.3 F (36.8 C) 98.2 F (36.8 C)  SpO2: 100% 100%   Wt Readings from Last 3 Encounters:  10/25/18 78.8 kg    General: Does not, alert, comfortable.  Does not look acutely or chronically ill but appears older than stated age. Head: Facial asymmetry.  Slight depression in the left frontal region above the eyebrow with minor ecchymosis. Eyes: No scleral icterus, no conjunctival pallor.  EOMI. Ears: Not hard of hearing Nose: No congestion or discharge. Mouth: Oropharynx moist, pink, clear.  Tongue midline.  Many teeth are missing. Neck: No JVD, no masses, no thyromegaly. Lungs: Clear bilaterally.  No labored breathing or cough. Heart: RRR.  No MRG. Abdomen: Soft.  Not distended.  Active bowel sounds no masses, HSM, bruits, hernias.  The only tenderness, mild, is above the region of the PD catheter on the left.   Rectal: Deferred Musc/Skeltl: No joint redness swelling or contracture deformities Extremities: No CCE.  Left foot wrapped with gauze, bandage removed for exam Neurologic: Oriented x3.  Moves all 4 limbs.  No tremors. Skin: No rash or sores. Tattoos: None Nodes: No cervical adenopathy. Psych: Pleasant, calm, cooperative.  Intake/Output from previous day: 04/30 0701 - 05/01 0700 In: 100 [P.O.:100] Out: -  Intake/Output this shift: No intake/output data recorded.  LAB RESULTS: Recent Labs    10/24/18 2137 10/25/18 0428 10/25/18 0751  WBC 8.5 8.3 8.1  HGB 10.9* 10.0* 10.2*  HCT 33.8* 30.6* 31.1*  PLT 172 172 171   BMET Lab Results  Component Value Date   NA 137 10/25/2018   NA 133 (L) 10/24/2018   K 3.6 10/25/2018  K 3.1 (L) 10/24/2018   CL 94 (L) 10/25/2018   CL 92 (L) 10/24/2018   CO2 29 10/25/2018   CO2 28 10/24/2018   GLUCOSE 90 10/25/2018   GLUCOSE 141 (H) 10/24/2018   BUN 48 (H) 10/25/2018   BUN 43 (H) 10/24/2018   CREATININE 12.42 (H) 10/25/2018   CREATININE 11.40 (H) 10/24/2018   CALCIUM 8.2 (L)  10/25/2018   CALCIUM 8.1 (L) 10/24/2018   LFT Recent Labs    10/24/18 1524  PROT 6.5  ALBUMIN 3.1*  AST 18  ALT 15  ALKPHOS 47  BILITOT 0.8  BILIDIR 0.2  IBILI 0.6   PT/INR Lab Results  Component Value Date   INR 1.1 10/24/2018   Hepatitis Panel No results for input(s): HEPBSAG, HCVAB, HEPAIGM, HEPBIGM in the last 72 hours. C-Diff No components found for: CDIFF Lipase  No results found for: LIPASE  Drugs of Abuse  No results found for: LABOPIA, COCAINSCRNUR, LABBENZ, AMPHETMU, THCU, LABBARB   RADIOLOGY STUDIES: Ct Head Wo Contrast  Result Date: 10/24/2018 CLINICAL DATA:  48 year old male with syncope EXAM: CT HEAD WITHOUT CONTRAST TECHNIQUE: Contiguous axial images were obtained from the base of the skull through the vertex without intravenous contrast. COMPARISON:  None. FINDINGS: Brain: No acute intracranial hemorrhage. No midline shift or mass effect. Gray-white differentiation maintained. Patchy hypodensity in the right frontal white matter overlying the ventricle. Unremarkable appearance of the ventricular system. Vascular: Intracranial atherosclerosis Skull: Acute comminuted slightly depressed fracture of the frontal wall of the left frontal sinus with air-fluid level of the frontal sinus. The fracture extends to the left superior orbit and the superior medial orbital wall. No evidence of globe injury. Mild soft tissue swelling. The depressed fracture results in mild contour abnormality of the left frontal scalp soft tissues in the supraorbital region. No additional fracture identified. Fracture does not extend superiorly Sinuses/Orbits: No evidence of globe injury. Air-fluid level of the left frontal sinus. Other: None IMPRESSION: Acute comminuted slightly depressed fracture of the frontal wall of the left frontal sinus with associated small hemosinus. The fracture extends inferiorly to the superior medial orbit. No evidence of acute intracranial hemorrhage or globe injury.  Intracranial atherosclerosis and mild white matter disease Electronically Signed   By: Corrie Mckusick D.O.   On: 10/24/2018 15:49   Mr Brain Wo Contrast  Result Date: 10/25/2018 CLINICAL DATA:  Initial evaluation for acute syncope. EXAM: MRI HEAD WITHOUT CONTRAST TECHNIQUE: Multiplanar, multiecho pulse sequences of the brain and surrounding structures were obtained without intravenous contrast. COMPARISON:  Prior CT from 10/24/2018 FINDINGS: Brain: Diffuse prominence of the CSF containing spaces compatible with generalized age-related cerebral atrophy. Patchy and confluent T2/FLAIR hyperintensity within the periventricular white matter most consistent with chronic small vessel ischemic disease, mild to moderate nature. Remote lacunar infarcts seen involving the bilateral basal ganglia/corona radiata, left larger than right. Associated chronic hemosiderin staining about the chronic left basal ganglia infarct. Chronic microvascular ischemic changes noted within the pons as well. No mass lesion, midline shift, or mass effect. Mild ex vacuo dilatation of the left lateral ventricle related to the chronic left basal ganglia lacunar infarct. No hydrocephalus. No discernible extra-axial fluid collection. Pituitary gland suprasellar region normal. Midline structures intact. 6 mm focus of restricted diffusion involving the left posterior periventricular white matter/splenium compatible with an acute ischemic infarct. No associated hemorrhage or mass effect. No other abnormal foci of restricted diffusion to suggest acute or subacute ischemia. Gray-white matter differentiation otherwise maintained. No encephalomalacia to suggest chronic  cortical infarction. No other evidence for acute or chronic intracranial hemorrhage. Vascular: Major intracranial vascular flow voids are well maintained. Skull and upper cervical spine: Craniocervical junction within normal limits. Upper cervical spine normal. Comminuted fracture involving  the outer table of the left frontal sinus with associated hemosinus again noted, better seen on prior CT. Sinuses/Orbits: Patient status post bilateral ocular lens replacement. Scattered mucosal thickening noted within the ethmoidal air cells. Layering blood within the left frontal sinus. Trace bilateral mastoid effusions, of doubtful significance. Inner ear structures grossly normal. Other: None. IMPRESSION: 1. 6 mm acute ischemic nonhemorrhagic infarct involving the posterior left periventricular white matter/left splenium. 2. Comminuted fracture involving the outer table of the left frontal sinus with associated hemosinus, better seen on prior CT. 3. Chronic lacunar infarcts involving the bilateral basal ganglia/corona radiata, left greater than right. Evidence for prior hemorrhage about the chronic left basal ganglia lacunar infarct. 4. Underlying age-related cerebral atrophy with moderate chronic microvascular ischemic disease. Electronically Signed   By: Jeannine Boga M.D.   On: 10/25/2018 03:58     IMPRESSION:   *  Hematemesis.  R/o ulcer, r/o MWT.   presumed dx gastroparesis.  Chronic non-bloody N/V and early satiety on Reglan AC.    *   Hx IDA.   Previous parenteral iron and procrit but not recently.   Work ups in 2017 with EGD, colonoscopy: path only in records.  Had ademomatous colon polyp.  2018 EGD?, mentioned in notes but no actual records.   *   Facial fracture post syncope related fall.    *    Ischemic, non-hemorrhagic CVA.  Older, chronic lacunar infarcts.    *    ESRD.  On nightly PD.  *    ASPVD.      PLAN:     *    EGD tomorrow.  IV BID Protonix .   Clears today.Azucena Freed  10/25/2018, 8:27 AM Phone [phone]   Attending physician's note   I have taken a history, examined the patient and reviewed the chart. I agree with the Advanced Practitioner's note, impression and recommendations.  66 yr M with h/o ESRD on PD, CHF, DM, gastroparesis admitted  after syncope, fall with fracture of frontal sinus, he had few episodes of hematemesis Hgb 11-10 No further episode of hematemesis this AM, hemodynamically stable Continue ASA 81mg  daily He was on Plavix at home, not clear if patient was taking it. Currently on hold Plan for EGD tomorrow AM  The risks and benefits as well as alternatives of endoscopic procedure(s) have been discussed and reviewed. All questions answered. The patient agrees to proceed.     Damaris Hippo , MD [phone]

## 2018-10-25 NOTE — Progress Notes (Signed)
Bilateral carotid duplex completed, preliminary results in Chart review CV Proc. [place] Starr Urias,RVS 10/25/2018, 5:15 pm

## 2018-10-26 ENCOUNTER — Inpatient Hospital Stay (HOSPITAL_COMMUNITY): Payer: BLUE CROSS/BLUE SHIELD | Admitting: Certified Registered Nurse Anesthetist

## 2018-10-26 ENCOUNTER — Other Ambulatory Visit: Payer: Self-pay

## 2018-10-26 ENCOUNTER — Encounter (HOSPITAL_COMMUNITY): Payer: Self-pay | Admitting: Gastroenterology

## 2018-10-26 ENCOUNTER — Encounter (HOSPITAL_COMMUNITY)
Admission: EM | Disposition: A | Discharge status: Home / Self Care | Payer: Self-pay | Source: Home / Self Care | Attending: Internal Medicine

## 2018-10-26 DIAGNOSIS — K2990 Gastroduodenitis, unspecified, without bleeding: Secondary | ICD-10-CM

## 2018-10-26 DIAGNOSIS — K208 Other esophagitis: Secondary | ICD-10-CM

## 2018-10-26 DIAGNOSIS — K2210 Ulcer of esophagus without bleeding: Secondary | ICD-10-CM

## 2018-10-26 DIAGNOSIS — K2970 Gastritis, unspecified, without bleeding: Secondary | ICD-10-CM

## 2018-10-26 DIAGNOSIS — K3189 Other diseases of stomach and duodenum: Secondary | ICD-10-CM

## 2018-10-26 HISTORY — PX: ESOPHAGOGASTRODUODENOSCOPY (EGD) WITH PROPOFOL: SHX5813

## 2018-10-26 HISTORY — PX: BIOPSY: SHX5522

## 2018-10-26 LAB — GLUCOSE, CAPILLARY: Glucose-Capillary: 77 mg/dL (ref 70–99)

## 2018-10-26 SURGERY — ESOPHAGOGASTRODUODENOSCOPY (EGD) WITH PROPOFOL
Anesthesia: General

## 2018-10-26 MED ORDER — SUCCINYLCHOLINE CHLORIDE 200 MG/10ML IV SOSY
PREFILLED_SYRINGE | INTRAVENOUS | Status: DC | PRN
  Administered 2018-10-26: 120 mg via INTRAVENOUS

## 2018-10-26 MED ORDER — ASPIRIN EC 81 MG PO TBEC: 81.0000 mg | DELAYED_RELEASE_TABLET | Freq: Every day | ORAL | 2 refills | Status: AC

## 2018-10-26 MED ORDER — PROPOFOL 10 MG/ML IV BOLUS
INTRAVENOUS | Status: DC | PRN
  Administered 2018-10-26: 150 mg via INTRAVENOUS

## 2018-10-26 MED ORDER — PANTOPRAZOLE SODIUM 40 MG PO TBEC: 40.0000 mg | DELAYED_RELEASE_TABLET | Freq: Two times a day (BID) | ORAL | Status: DC

## 2018-10-26 MED ORDER — PROMETHAZINE HCL 25 MG/ML IJ SOLN
6.2500 mg | Freq: Once | INTRAMUSCULAR | Status: AC
  Administered 2018-10-26: 6.25 mg via INTRAVENOUS

## 2018-10-26 MED ORDER — AMLODIPINE BESYLATE 5 MG PO TABS: 5.0000 mg | ORAL_TABLET | Freq: Every day | ORAL | Status: AC

## 2018-10-26 MED ORDER — PROMETHAZINE HCL 25 MG/ML IJ SOLN
INTRAMUSCULAR | Status: AC
  Administered 2018-10-26: 6.25 mg via INTRAVENOUS
  Filled 2018-10-26: qty 1

## 2018-10-26 MED ORDER — FERROUS SULFATE 325 (65 FE) MG PO TABS: 325.0000 mg | ORAL_TABLET | Freq: Two times a day (BID) | ORAL | Status: DC

## 2018-10-26 MED ORDER — FERROUS SULFATE 325 (65 FE) MG PO TABS: 325.0000 mg | ORAL_TABLET | Freq: Two times a day (BID) | ORAL | 3 refills | Status: AC

## 2018-10-26 MED ORDER — SUCRALFATE 1 GM/10ML PO SUSP: 1.0000 g | Freq: Three times a day (TID) | ORAL | Status: DC

## 2018-10-26 MED ORDER — FENTANYL CITRATE (PF) 100 MCG/2ML IJ SOLN: 25.0000 ug | INTRAMUSCULAR | Status: DC | PRN

## 2018-10-26 MED ORDER — LIDOCAINE 2% (20 MG/ML) 5 ML SYRINGE
INTRAMUSCULAR | Status: DC | PRN
  Administered 2018-10-26: 100 mg via INTRAVENOUS

## 2018-10-26 MED ORDER — PROMETHAZINE HCL 25 MG/ML IJ SOLN: 6.2500 mg | INTRAMUSCULAR | Status: DC | PRN

## 2018-10-26 MED ORDER — HYDRALAZINE HCL 20 MG/ML IJ SOLN
INTRAMUSCULAR | Status: AC
  Filled 2018-10-26: qty 1

## 2018-10-26 MED ORDER — ATORVASTATIN CALCIUM 80 MG PO TABS: 80.0000 mg | ORAL_TABLET | Freq: Every day | ORAL | 0 refills | Status: AC

## 2018-10-26 MED ORDER — PANTOPRAZOLE SODIUM 40 MG PO TBEC: 40.0000 mg | DELAYED_RELEASE_TABLET | Freq: Two times a day (BID) | ORAL | 0 refills | Status: AC

## 2018-10-26 MED ORDER — SODIUM CHLORIDE 0.9 % IV SOLN
INTRAVENOUS | Status: DC | PRN
  Administered 2018-10-26: 08:00:00 via INTRAVENOUS

## 2018-10-26 MED ORDER — DEXAMETHASONE SODIUM PHOSPHATE 10 MG/ML IJ SOLN
INTRAMUSCULAR | Status: DC | PRN
  Administered 2018-10-26: 5 mg via INTRAVENOUS

## 2018-10-26 MED ORDER — ONDANSETRON HCL 4 MG/2ML IJ SOLN
INTRAMUSCULAR | Status: DC | PRN
  Administered 2018-10-26: 4 mg via INTRAVENOUS

## 2018-10-26 MED ORDER — HYDRALAZINE HCL 100 MG PO TABS: 100.0000 mg | ORAL_TABLET | Freq: Two times a day (BID) | ORAL | Status: AC

## 2018-10-26 MED ORDER — SUCRALFATE 1 GM/10ML PO SUSP: 1.0000 g | Freq: Three times a day (TID) | ORAL | 0 refills | Status: AC

## 2018-10-26 SURGICAL SUPPLY — 15 items

## 2018-10-26 NOTE — Anesthesia Postprocedure Evaluation (Signed)
Anesthesia Post Note  Patient: Jesse Mcgee  Procedure(s) Performed: ESOPHAGOGASTRODUODENOSCOPY (EGD) WITH PROPOFOL (N/A ) BIOPSY     Patient location during evaluation: PACU Anesthesia Type: General Level of consciousness: sedated Pain management: pain level controlled Vital Signs Assessment: post-procedure vital signs reviewed and stable Respiratory status: spontaneous breathing and respiratory function stable Cardiovascular status: stable Postop Assessment: no apparent nausea or vomiting Anesthetic complications: no    Last Vitals:  Vitals:   10/26/18 0755 10/26/18 0843  BP: (!) 201/95 122/90  Pulse: 64 74  Resp: 18 16  Temp:  (!) 36.2 C  SpO2: 100% 100%    Last Pain:  Vitals:   10/26/18 0843  TempSrc:   PainSc: 0-No pain                 Jesse Mcgee DANIEL

## 2018-10-26 NOTE — Progress Notes (Signed)
Tobias Alexander MD made aware of Pt. BO prior to procedure, advises to give PRN IV Hydralazine.

## 2018-10-26 NOTE — Progress Notes (Addendum)
Pt given discharge instructions, prescriptions, and care notes. Pt verbalized understanding AEB no further questions or concerns at this time. IV was discontinued, no redness, pain, or swelling noted at this time. Telemetry discontinued and Centralized Telemetry was notified. Pt left the floor via wheelchair with staff in stable condition. Skin dry and intact. 

## 2018-10-26 NOTE — Anesthesia Procedure Notes (Signed)
Procedure Name: Intubation Date/Time: 10/26/2018 8:19 AM Performed by: Clearnce Sorrel, CRNA Pre-anesthesia Checklist: Emergency Drugs available, Patient identified, Suction available, Patient being monitored and Timeout performed Patient Re-evaluated:Patient Re-evaluated prior to induction Oxygen Delivery Method: Circle system utilized Preoxygenation: Pre-oxygenation with 100% oxygen Induction Type: Rapid sequence Laryngoscope Size: 4 and Mac Grade View: Grade I Tube type: Oral Tube size: 7.5 mm Number of attempts: 1 Airway Equipment and Method: Stylet Placement Confirmation: ETT inserted through vocal cords under direct vision,  positive ETCO2 and breath sounds checked- equal and bilateral Secured at: 23 cm Tube secured with: Tape Dental Injury: Teeth and Oropharynx as per pre-operative assessment

## 2018-10-26 NOTE — Progress Notes (Signed)
Occupational Therapy Treatment Patient Details Name: Jesse Mcgee MRN: [id number] DOB: 1970/07/17 Today's Date: 10/26/2018    History of present illness Jesse Mcgee  is a 48 y.o. male with medical history significant of ESRD-on nightly peritoneal dialysis, hypertension, hyperlipidemia, depression, diet-controlled diabetes, who presents with syncope and hematemesis.   OT comments  Pt demonstrates improved activity tolerance today.  He requires min guard assist for ADLs and functional mobility.  He is eager to discharge home.   Follow Up Recommendations  Home health OT;Supervision - Intermittent    Equipment Recommendations  None recommended by OT    Recommendations for Other Services      Precautions / Restrictions Precautions Precautions: Fall Restrictions Weight Bearing Restrictions: No       Mobility Bed Mobility Overal bed mobility: Independent                Transfers Overall transfer level: Modified independent Equipment used: None;Rolling walker (2 wheeled)             General transfer comment: denied dizziness    Balance Overall balance assessment: Needs assistance   Sitting balance-Leahy Scale: Good       Standing balance-Leahy Scale: Fair                             ADL either performed or assessed with clinical judgement   ADL Overall ADL's : Needs assistance/impaired Eating/Feeding: Set up;Sitting   Grooming: Wash/dry hands;Wash/dry face;Oral care;Brushing hair;Min guard;Standing       Lower Body Bathing: Min guard;Sit to/from stand       Lower Body Dressing: Min guard;Sit to/from stand       Toileting- Water quality scientist and Hygiene: Min guard;Sit to/from stand       Functional mobility during ADLs: Passenger transport manager     Praxis      Cognition Arousal/Alertness: Awake/alert Behavior During Therapy: WFL for tasks assessed/performed Overall Cognitive  Status: Within Functional Limits for tasks assessed                                          Exercises     Shoulder Instructions       General Comments Pt is eager to return home.   VSS     Pertinent Vitals/ Pain       Pain Assessment: 0-10  Home Living                                          Prior Functioning/Environment              Frequency  Min 3X/week        Progress Toward Goals  OT Goals(current goals can now be found in the care plan section)  Progress towards OT goals: Progressing toward goals  Acute Rehab OT Goals Patient Stated Goal: go home  Plan Discharge plan remains appropriate    Co-evaluation                 AM-PAC OT "6 Clicks" Daily Activity     Outcome Measure   Help from another person eating meals?: None Help from another person taking care of personal grooming?:  A Little Help from another person toileting, which includes using toliet, bedpan, or urinal?: A Little Help from another person bathing (including washing, rinsing, drying)?: A Little Help from another person to put on and taking off regular upper body clothing?: None Help from another person to put on and taking off regular lower body clothing?: A Little 6 Click Score: 20    End of Session Equipment Utilized During Treatment: Rolling walker  OT Visit Diagnosis: Unsteadiness on feet (R26.81);Other abnormalities of gait and mobility (R26.89);Muscle weakness (generalized) (M62.81)   Activity Tolerance Patient tolerated treatment well   Patient Left in bed;with call bell/phone within reach;with bed alarm set   Nurse Communication Mobility status        Time: 2641-5830 OT Time Calculation (min): 13 min  Charges: OT General Charges $OT Visit: 1 Visit OT Treatments $Therapeutic Activity: 8-22 mins  Lucille Passy, OTR/L Acute Rehabilitation Services Pager [phone] Office [phone]    Lucille Passy M 10/26/2018,  2:28 PM

## 2018-10-26 NOTE — Discharge Instructions (Signed)
Bland Diet A bland diet consists of foods that are often soft and do not have a lot of fat, fiber, or extra seasonings. Foods without fat, fiber, or seasoning are easier for the body to digest. They are also less likely to irritate your mouth, throat, stomach, and other parts of your digestive system. A bland diet is sometimes called a BRAT diet. What is my plan? Your health care provider or food and nutrition specialist (dietitian) may recommend specific changes to your diet to prevent symptoms or to treat your symptoms. These changes may include:  Eating small meals often.  Cooking food until it is soft enough to chew easily.  Chewing your food well.  Drinking fluids slowly.  Not eating foods that are very spicy, sour, or fatty.  Not eating citrus fruits, such as oranges and grapefruit. What do I need to know about this diet?  Eat a variety of foods from the bland diet food list.  Do not follow a bland diet longer than needed.  Ask your health care provider whether you should take vitamins or supplements. What foods can I eat? Grains  Hot cereals, such as cream of wheat. Rice. Bread, crackers, or tortillas made from refined white flour. Vegetables Canned or cooked vegetables. Mashed or boiled potatoes. Fruits  Bananas. Applesauce. Other types of cooked or canned fruit with the skin and seeds removed, such as canned peaches or pears. Meats and other proteins  Scrambled eggs. Creamy peanut butter or other nut butters. Lean, well-cooked meats, such as chicken or fish. Tofu. Soups or broths. Dairy Low-fat dairy products, such as milk, cottage cheese, or yogurt. Beverages  Water. Herbal tea. Apple juice. Fats and oils Mild salad dressings. Canola or olive oil. Sweets and desserts Pudding. Custard. Fruit gelatin. Ice cream. The items listed above may not be a complete list of recommended foods and beverages. Contact a dietitian for more options. What foods are not  recommended? Grains Whole grain breads and cereals. Vegetables Raw vegetables. Fruits Raw fruits, especially citrus, berries, or dried fruits. Dairy Whole fat dairy foods. Beverages Caffeinated drinks. Alcohol. Seasonings and condiments Strongly flavored seasonings or condiments. Hot sauce. Salsa. Other foods Spicy foods. Fried foods. Sour foods, such as pickled or fermented foods. Foods with high sugar content. Foods high in fiber. The items listed above may not be a complete list of foods and beverages to avoid. Contact a dietitian for more information. Summary  A bland diet consists of foods that are often soft and do not have a lot of fat, fiber, or extra seasonings.  Foods without fat, fiber, or seasoning are easier for the body to digest.  Check with your health care provider to see how long you should follow this diet plan. It is not meant to be followed for long periods. This information is not intended to replace advice given to you by your health care provider. Make sure you discuss any questions you have with your health care provider. Document Released: 10/04/2015 Document Revised: 07/11/2017 Document Reviewed: 07/11/2017 Elsevier Interactive Patient Education  2019 [employer].    Stroke Prevention Some medical conditions and lifestyle choices can lead to a higher risk for a stroke. You can help to prevent a stroke by making nutrition, lifestyle, and other changes. What nutrition changes can be made?   Eat healthy foods. ? Choose foods that are high in fiber. These include:  Fresh fruits.  Fresh vegetables.  Whole grains. ? Eat at least 5 or more servings of  fruits and vegetables each day. Try to fill half of your plate at each meal with fruits and vegetables. ? Choose lean protein foods. These include:  Lowfat (lean) cuts of meat.  Chicken without skin.  Fish.  Tofu.  Beans.  Nuts. ? Eat low-fat dairy products. ? Avoid foods that:  Are high  in salt (sodium).  Have saturated fat.  Have trans fat.  Have cholesterol.  Are processed.  Are premade.  Follow eating guidelines as told by your doctor. These may include: ? Reducing how many calories you eat and drink each day. ? Limiting how much salt you eat or drink each day to 1,500 milligrams (mg). ? Using only healthy fats for cooking. These include:  Olive oil.  Canola oil.  Sunflower oil. ? Counting how many carbohydrates you eat and drink each day. What lifestyle changes can be made?  Try to stay at a healthy weight. Talk to your doctor about what a good weight is for you.  Get at least 30 minutes of moderate physical activity at least 5 days a week. This can include: ? Fast walking. ? Biking. ? Swimming.  Do not use any products that have nicotine or tobacco. This includes cigarettes and e-cigarettes. If you need help quitting, ask your doctor. Avoid being around tobacco smoke in general.  Limit how much alcohol you drink to no more than 1 drink a day for nonpregnant women and 2 drinks a day for men. One drink equals 12 oz of beer, 5 oz of wine, or 1 oz of hard liquor.  Do not use drugs.  Avoid taking birth control pills. Talk to your doctor about the risks of taking birth control pills if: ? You are over 85 years old. ? You smoke. ? You get migraines. ? You have had a blood clot. What other changes can be made?  Manage your cholesterol. ? It is important to eat a healthy diet. ? If your cholesterol cannot be managed through your diet, you may also need to take medicines. Take medicines as told by your doctor.  Manage your diabetes. ? It is important to eat a healthy diet and to exercise regularly. ? If your blood sugar cannot be managed through diet and exercise, you may need to take medicines. Take medicines as told by your doctor.  Control your high blood pressure (hypertension). ? Try to keep your blood pressure below 130/80. This can help lower  your risk of stroke. ? It is important to eat a healthy diet and to exercise regularly. ? If your blood pressure cannot be managed through diet and exercise, you may need to take medicines. Take medicines as told by your doctor. ? Ask your doctor if you should check your blood pressure at home. ? Have your blood pressure checked every year. Do this even if your blood pressure is normal.  Talk to your doctor about getting checked for a sleep disorder. Signs of this can include: ? Snoring a lot. ? Feeling very tired.  Take over-the-counter and prescription medicines only as told by your doctor. These may include aspirin or blood thinners (antiplatelets or anticoagulants).  Make sure that any other medical conditions you have are managed. Where to find more information  American Stroke Association: www.strokeassociation.org  National Stroke Association: www.stroke.org Get help right away if:  You have any symptoms of stroke. "BE FAST" is an easy way to remember the main warning signs: ? B - Balance. Signs are dizziness, sudden trouble walking,  or loss of balance. ? E - Eyes. Signs are trouble seeing or a sudden change in how you see. ? F - Face. Signs are sudden weakness or loss of feeling of the face, or the face or eyelid drooping on one side. ? A - Arms. Signs are weakness or loss of feeling in an arm. This happens suddenly and usually on one side of the body. ? S - Speech. Signs are sudden trouble speaking, slurred speech, or trouble understanding what people say. ? T - Time. Time to call emergency services. Write down what time symptoms started.  You have other signs of stroke, such as: ? A sudden, very bad headache with no known cause. ? Feeling sick to your stomach (nausea). ? Throwing up (vomiting). ? Jerky movements you cannot control (seizure). These symptoms may represent a serious problem that is an emergency. Do not wait to see if the symptoms will go away. Get medical help  right away. Call your local emergency services (911 in the [place] Do not drive yourself to the hospital. Summary  You can prevent a stroke by eating healthy, exercising, not smoking, drinking less alcohol, and treating other health problems, such as diabetes, high blood pressure, or high cholesterol.  Do not use any products that contain nicotine or tobacco, such as cigarettes and e-cigarettes.  Get help right away if you have any signs or symptoms of a stroke. This information is not intended to replace advice given to you by your health care provider. Make sure you discuss any questions you have with your health care provider. Document Released: 12/12/2011 Document Revised: 09/13/2016 Document Reviewed: 09/13/2016 Elsevier Interactive Patient Education  2019 [employer].

## 2018-10-26 NOTE — Progress Notes (Signed)
Physical Therapy Treatment Patient Details Name: Jesse Mcgee MRN: [id number] DOB: Jul 17, 1970 Today's Date: 10/26/2018    History of Present Illness Jesse Mcgee  is a 48 y.o. male with medical history significant of ESRD-on nightly peritoneal dialysis, hypertension, hyperlipidemia, depression, diet-controlled diabetes, who presents with syncope and hematemesis.    PT Comments    Pt preparing for d/c home, ambulated 300' without AD, becomes unsteady with increasing distance. Practiced steps with rail and pt did not have difficulty. Educated pt on fall risk and the need for use of RW when he is fatigued. Ready for d/c home, to be followed by HHPT for balance.     Follow Up Recommendations  Home health PT;Supervision/Assistance - 24 hour     Equipment Recommendations  None recommended by PT    Recommendations for Other Services       Precautions / Restrictions Precautions Precautions: Fall Restrictions Weight Bearing Restrictions: No    Mobility  Bed Mobility Overal bed mobility: Independent                Transfers Overall transfer level: Modified independent Equipment used: None             General transfer comment: denied dizziness  Ambulation/Gait Ambulation/Gait assistance: Supervision Gait Distance (Feet): 300 Feet Assistive device: None Gait Pattern/deviations: Step-through pattern;Narrow base of support Gait velocity: decreased Gait velocity interpretation: >2.62 ft/sec, indicative of community ambulatory General Gait Details: mild scissoring and unsteadiness with fatigue. Educated him on using RW first thing in the morning and any time he feels fatigued.    Stairs Stairs: Yes Stairs assistance: Supervision Stair Management: One rail Right;Alternating pattern;Forwards Number of Stairs: 3 General stair comments: no difficulty with srairs with use of rail   Wheelchair Mobility    Modified Rankin (Stroke Patients Only)        Balance Overall balance assessment: Needs assistance   Sitting balance-Leahy Scale: Good       Standing balance-Leahy Scale: Fair                              Cognition Arousal/Alertness: Awake/alert Behavior During Therapy: WFL for tasks assessed/performed Overall Cognitive Status: Within Functional Limits for tasks assessed                                        Exercises      General Comments General comments (skin integrity, edema, etc.): pt returning home with wife      Pertinent Vitals/Pain Pain Assessment: No/denies pain    Home Living                      Prior Function            PT Goals (current goals can now be found in the care plan section) Acute Rehab PT Goals Patient Stated Goal: go home PT Goal Formulation: With patient/family Time For Goal Achievement: 11/08/18 Potential to Achieve Goals: Good Progress towards PT goals: Goals met/education completed, patient discharged from PT(defer to next venue)    Frequency    Min 3X/week      PT Plan Current plan remains appropriate    Co-evaluation              AM-PAC PT "6 Clicks" Mobility   Outcome Measure  Help needed turning from your back to your  side while in a flat bed without using bedrails?: None Help needed moving from lying on your back to sitting on the side of a flat bed without using bedrails?: None Help needed moving to and from a bed to a chair (including a wheelchair)?: None Help needed standing up from a chair using your arms (e.g., wheelchair or bedside chair)?: None Help needed to walk in hospital room?: A Little Help needed climbing 3-5 steps with a railing? : None 6 Click Score: 23    End of Session   Activity Tolerance: Patient tolerated treatment well Patient left: (w/c for d/c) Nurse Communication: Mobility status PT Visit Diagnosis: Unsteadiness on feet (R26.81)     Time: 5974-1638 PT Time Calculation (min) (ACUTE  ONLY): 8 min  Charges:  $Gait Training: 8-22 mins                     Leighton Roach, PT  Acute Rehab Services  Pager [phone] Office [employer] 10/26/2018, 1:18 PM

## 2018-10-26 NOTE — Interval H&P Note (Signed)
History and Physical Interval Note:  10/26/2018 7:24 AM  Jesse Mcgee  has presented today for surgery, with the diagnosis of gi bleed.  hematemesis.  The various methods of treatment have been discussed with the patient and family. After consideration of risks, benefits and other options for treatment, the patient has consented to  Procedure(s): ESOPHAGOGASTRODUODENOSCOPY (EGD) WITH PROPOFOL (N/A) as a surgical intervention.  The patient's history has been reviewed, patient examined, no change in status, stable for surgery.  I have reviewed the patient's chart and labs.  Questions were answered to the patient's satisfaction.     Leonel Mccollum

## 2018-10-26 NOTE — Progress Notes (Addendum)
STROKE TEAM PROGRESS NOTE   INTERVAL HISTORY Pt is walking with PT in hallway. And currently wheelchair is waiting to transport him to the front desk as he has been discharged and his wife is waiting outside to pick him up. He walked well with PT in the hallway.     Vitals:   10/26/18 0843 10/26/18 0900 10/26/18 0915 10/26/18 0930  BP: 122/90 (!) 142/77 (!) 148/79 (!) 156/82  Pulse: 74 68 67 66  Resp: 16 20 20 18   Temp: (!) 97.2 F (36.2 C)   98 F (36.7 C)  TempSrc:      SpO2: 100% 100% 100% 99%  Weight:      Height:        CBC:  Recent Labs  Lab 10/24/18 1524  10/25/18 0428 10/25/18 0751 10/25/18 1255  WBC 7.6   < > 8.3 8.1  --   NEUTROABS 5.3  --   --   --   --   HGB 11.2*   < > 10.0* 10.2* 9.5*  HCT 34.4*   < > 30.6* 31.1* 29.1*  MCV 93.0   < > 91.1 90.7  --   PLT 163   < > 172 171  --    < > = values in this interval not displayed.    Basic Metabolic Panel:  Recent Labs  Lab 10/24/18 1524 10/25/18 0428  NA 133* 137  K 3.1* 3.6  CL 92* 94*  CO2 28 29  GLUCOSE 141* 90  BUN 43* 48*  CREATININE 11.40* 12.42*  CALCIUM 8.1* 8.2*   Lipid Panel:     Component Value Date/Time   CHOL 132 10/25/2018 0428   TRIG 74 10/25/2018 0428   HDL 37 (L) 10/25/2018 0428   CHOLHDL 3.6 10/25/2018 0428   VLDL 15 10/25/2018 0428   LDLCALC 80 10/25/2018 0428   HgbA1c:  Lab Results  Component Value Date   HGBA1C 7.5 (H) 10/25/2018   Urine Drug Screen: No results found for: LABOPIA, COCAINSCRNUR, LABBENZ, AMPHETMU, THCU, LABBARB  Alcohol Level No results found for: ETH   IMAGING  Ct Head Wo Contrast 10/24/2018 IMPRESSION:  Acute comminuted slightly depressed fracture of the frontal wall of the left frontal sinus with associated small hemosinus. The fracture extends inferiorly to the superior medial orbit. No evidence of acute intracranial hemorrhage or globe injury. Intracranial atherosclerosis and mild white matter disease    Mr Brain Wo  Contrast 10/25/2018 IMPRESSION:  1. 6 mm acute ischemic nonhemorrhagic infarct involving the posterior left periventricular white matter/left splenium.  2. Comminuted fracture involving the outer table of the left frontal sinus with associated hemosinus, better seen on prior CT.  3. Chronic lacunar infarcts involving the bilateral basal ganglia/corona radiata, left greater than right. Evidence for prior hemorrhage about the chronic left basal ganglia lacunar infarct.  4. Underlying age-related cerebral atrophy with moderate chronic microvascular ischemic disease.    Vas [place] Transcranial Doppler / Carotid Doppler 10/25/2018 Technician Comments. Technically difficult due to suboptimal windows. Summary:  Right Carotid: Velocities in the right ICA are consistent with a 1-39% stenosis.  Left Carotid: Velocities in the left ICA are consistent with a 1-39% stenosis. Vertebrals:   Bilateral vertebral arteries demonstrate antegrade flow.  Subclavians: Normal flow hemodynamics were seen in bilateral subclavian arteries.  Transthoracic Echocardiogram  10/25/2018 IMPRESSIONS 1. The left ventricle has hyperdynamic systolic function, with an ejection fraction of >65%. The cavity size was normal. There is mildly increased left ventricular wall thickness.  2.  The right ventricle has normal systolc function. The cavity was normal. There is no increase in right ventricular wall thickness.    PHYSICAL EXAM Pleasant middle-age male currently not in distress..  He has slight deformity of the left supraorbital region from recent fall and injury . Afebrile. Head is nontraumatic. Neck is supple without bruit.    Cardiac exam no murmur or gallop. Lungs are clear to auscultation. Distal pulses are well felt. Neurological Exam ;  Awake  Alert oriented x 3. Normal speech and language.eye movements full without nystagmus.fundi were not visualized. Vision acuity and fields appear normal. Hearing is normal. Palatal  movements are normal. Face symmetric. Tongue midline. Normal strength, tone, reflexes and coordination. Normal sensation. Gait deferred.  ASSESSMENT/PLAN [name] is a 48 y.o. 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   Stroke:  Incidental left periventricular white matter infarct secondary to small vessel disease source  CT head L frontal sinus wall fracture extending into superior medial orbid w/ small hemosinus. No acute stroke. Small vessel disease.   MRI  L posterior PVWM/splenium infarct. comminuted fx outer L frontal sinus w/ hemosinus. Old L>R B BG/CR infarct. Old L BG hemorrhage. Small vessel disease. Atrophy.   Carotid Doppler - unremarkable  2D Echo - EF > 65%. No cardiac source of emboli identified.   Transcranial doppler - Technician Comments. Technically difficult due to suboptimal windows.  LDL 80  HgbA1c 7.5  SCDs for VTE prophylaxis  clopidogrel 75 mg daily prior to admission, now on aspirin 81 mg daily and continue at d/c  Therapy recommendations:  Home health PT and OT  Disposition:  pending   Hypertensive Emergency  BP as high as 206/117 . Now Stable  . Long-term BP goal normotensive  Hyperlipidemia  Home meds:  lipitor 80, resumed in hospital  LDL 80, goal < 70   On lipitor 80  Continue statin at discharge  Diabetes type II Uncontrolled  HgbA1c 7.5, goal < 7.0  SSI  CBG monitoring  Close follow up with PCP  Hematemesis   No recurrence  Hb 10.0->10.2->9.5  EGD today showed small non-bleeding esophageal ulcer  protonix 40mg  bid  Continue ASA  Other Stroke Risk Factors  + troponin, no CP. Trending. 0.13 -> 0.12 -> 0.16 -> 0.16  Other Active Problems  Syncope w/ fall and LOC, possible related to valsalva during BM, orthostasis  L frontal sinus fx from fall, likely etiology of hematemesis.  Candidate for cosmetic correction surgery within next 2 weeks if pt interested, either as IP or OP  ESRD on peritoneal dialysis - went well last night   Hospital day # 2  Neurology will sign off. Please call with questions. Pt will follow up with stroke clinic NP at [facility] in about 4 weeks. Thanks for the consult.  Rosalin Hawking, MD PhD Stroke Neurology 10/26/2018 1:18 PM  To contact Stroke Continuity provider, please refer to [email or web address] After hours, contact General Neurology

## 2018-10-26 NOTE — Anesthesia Preprocedure Evaluation (Addendum)
Anesthesia Evaluation  Patient identified by MRN, date of birth, ID band Patient awake    Reviewed: Allergy & Precautions, NPO status , Patient's Chart, lab work & pertinent test results  History of Anesthesia Complications Negative for: history of anesthetic complications  Airway Mallampati: II  TM Distance: >3 FB     Dental no notable dental hx. (+) Dental Advisory Given   Pulmonary neg pulmonary ROS,    Pulmonary exam normal        Cardiovascular hypertension, Pt. on home beta blockers and Pt. on medications Normal cardiovascular exam  IMPRESSIONS    1. The left ventricle has hyperdynamic systolic function, with an ejection fraction of >65%. The cavity size was normal. There is mildly increased left ventricular wall thickness.  2. The right ventricle has normal systolc function. The cavity was normal. There is no increase in right ventricular wall thickness.    Neuro/Psych PSYCHIATRIC DISORDERS Depression CVA    GI/Hepatic negative GI ROS, Neg liver ROS,   Endo/Other  diabetes  Renal/GU CRFRenal disease  negative genitourinary   Musculoskeletal negative musculoskeletal ROS (+)   Abdominal   Peds negative pediatric ROS (+)  Hematology negative hematology ROS (+)   Anesthesia Other Findings   Reproductive/Obstetrics negative OB ROS                            Anesthesia Physical Anesthesia Plan  ASA: III  Anesthesia Plan: General   Post-op Pain Management:    Induction: Intravenous  PONV Risk Score and Plan: 2 and Ondansetron and Dexamethasone  Airway Management Planned: Oral ETT  Additional Equipment:   Intra-op Plan:   Post-operative Plan: Extubation in OR  Informed Consent: I have reviewed the patients History and Physical, chart, labs and discussed the procedure including the risks, benefits and alternatives for the proposed anesthesia with the patient or  authorized representative who has indicated his/her understanding and acceptance.     Dental advisory given  Plan Discussed with: Anesthesiologist and CRNA  Anesthesia Plan Comments:        Anesthesia Quick Evaluation

## 2018-10-26 NOTE — Progress Notes (Addendum)
Subjective:  Feels better , noted for DC home , tolerated PD Last PM   Objective Vital signs in last 24 hours: Vitals:   10/26/18 0843 10/26/18 0900 10/26/18 0915 10/26/18 0930  BP: 122/90 (!) 142/77 (!) 148/79 (!) 156/82  Pulse: 74 68 67 66  Resp: 16 20 20 18   Temp: (!) 97.2 F (36.2 C)   98 F (36.7 C)  TempSrc:      SpO2: 100% 100% 100% 99%  Weight:      Height:       Weight change: 0.957 kg  Physical Exam: General=alert, thin , chron ill appearing, no distress Chest =clear bilat no rales or wheezing Card=RRR no MRG Abd= soft ntnd no mass or ascites +bs +PD cath LLQ Ext=no LE edema, , muscle wasting in LE's Neuro= is alert, Ox 3 ,  up in bed getting dressed    Home meds:  - carvedilol 12.5 bid/ isosorbide mononitrate 60 qd/ atorvastatin 40   - escitalopram 10 hs/ zolpidem 5 hs prn  - sevelamer carb tid ac/ prn reglan    CCPD 7d/ week: ([facility] in [place], [place], outside of [employer])  5 exchanges   2200 vol overnight, last fill 2000 Icodextrin  10 hrs   Dry wt 160- 165lbs      Problem/Plan: 1. Syncope- resolved / seen by PT/ot , Neuro/ now   No dizziness with standing  2. 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 Hematemesis - at home prior to admission per patient.EGD = Nonbld esophageal ulcer/ La grade c erosive esophagitis,Cardia erythematous mucosa ='Ok to restart Plavix in 5days  "  4. ESRD on CCPD - wt's up 173 lbs then down to 167 / Had CCPD all 2.5 % last night / ok to return ot his normal PD Rx as op .  Dry wt 165 lb 5. HTN - bp's stable  Ernest Haber, PA-C [facility] Beeper [phone] 10/26/2018,12:09 PM  LOS: 2 days   Pt seen, examined and agree w A/P as above.  [facility] 10/26/2018, 1:10 PM    Labs: Basic Metabolic Panel: Recent Labs  Lab 10/24/18 1524 10/25/18 0428  NA 133* 137  K 3.1* 3.6  CL 92* 94*  CO2 28 29  GLUCOSE 141* 90  BUN 43* 48*  CREATININE 11.40* 12.42*  CALCIUM  8.1* 8.2*   Liver Function Tests: Recent Labs  Lab 10/24/18 1524  AST 18  ALT 15  ALKPHOS 47  BILITOT 0.8  PROT 6.5  ALBUMIN 3.1*   No results for input(s): LIPASE, AMYLASE in the last 168 hours. No results for input(s): AMMONIA in the last 168 hours. CBC: Recent Labs  Lab 10/24/18 1524 10/24/18 2137 10/25/18 0428 10/25/18 0751 10/25/18 1255  WBC 7.6 8.5 8.3 8.1  --   NEUTROABS 5.3  --   --   --   --   HGB 11.2* 10.9* 10.0* 10.2* 9.5*  HCT 34.4* 33.8* 30.6* 31.1* 29.1*  MCV 93.0 91.6 91.1 90.7  --   PLT 163 172 172 171  --    Cardiac Enzymes: Recent Labs  Lab 10/24/18 2145 10/25/18 0428 10/25/18 0751 10/25/18 1255  TROPONINI 0.13* 0.12* 0.16* 0.16*   CBG: Recent Labs  Lab 10/24/18 1515 10/24/18 2220 10/26/18 0848  GLUCAP 131* 96 77    Studies/Results: Ct Head Wo Contrast  Result Date: 10/24/2018 CLINICAL DATA:  48 year old male with syncope EXAM: CT HEAD WITHOUT CONTRAST TECHNIQUE: Contiguous axial images  were obtained from the base of the skull through the vertex without intravenous contrast. COMPARISON:  None. FINDINGS: Brain: No acute intracranial hemorrhage. No midline shift or mass effect. Gray-white differentiation maintained. Patchy hypodensity in the right frontal white matter overlying the ventricle. Unremarkable appearance of the ventricular system. Vascular: Intracranial atherosclerosis Skull: Acute comminuted slightly depressed fracture of the frontal wall of the left frontal sinus with air-fluid level of the frontal sinus. The fracture extends to the left superior orbit and the superior medial orbital wall. No evidence of globe injury. Mild soft tissue swelling. The depressed fracture results in mild contour abnormality of the left frontal scalp soft tissues in the supraorbital region. No additional fracture identified. Fracture does not extend superiorly Sinuses/Orbits: No evidence of globe injury. Air-fluid level of the left frontal sinus. Other: None  IMPRESSION: Acute comminuted slightly depressed fracture of the frontal wall of the left frontal sinus with associated small hemosinus. The fracture extends inferiorly to the superior medial orbit. No evidence of acute intracranial hemorrhage or globe injury. Intracranial atherosclerosis and mild white matter disease Electronically Signed   By: Corrie Mckusick D.O.   On: 10/24/2018 15:49   Mr Brain Wo Contrast  Result Date: 10/25/2018 CLINICAL DATA:  Initial evaluation for acute syncope. EXAM: MRI HEAD WITHOUT CONTRAST TECHNIQUE: Multiplanar, multiecho pulse sequences of the brain and surrounding structures were obtained without intravenous contrast. COMPARISON:  Prior CT from 10/24/2018 FINDINGS: Brain: Diffuse prominence of the CSF containing spaces compatible with generalized age-related cerebral atrophy. Patchy and confluent T2/FLAIR hyperintensity within the periventricular white matter most consistent with chronic small vessel ischemic disease, mild to moderate nature. Remote lacunar infarcts seen involving the bilateral basal ganglia/corona radiata, left larger than right. Associated chronic hemosiderin staining about the chronic left basal ganglia infarct. Chronic microvascular ischemic changes noted within the pons as well. No mass lesion, midline shift, or mass effect. Mild ex vacuo dilatation of the left lateral ventricle related to the chronic left basal ganglia lacunar infarct. No hydrocephalus. No discernible extra-axial fluid collection. Pituitary gland suprasellar region normal. Midline structures intact. 6 mm focus of restricted diffusion involving the left posterior periventricular white matter/splenium compatible with an acute ischemic infarct. No associated hemorrhage or mass effect. No other abnormal foci of restricted diffusion to suggest acute or subacute ischemia. Gray-white matter differentiation otherwise maintained. No encephalomalacia to suggest chronic cortical infarction. No other  evidence for acute or chronic intracranial hemorrhage. Vascular: Major intracranial vascular flow voids are well maintained. Skull and upper cervical spine: Craniocervical junction within normal limits. Upper cervical spine normal. Comminuted fracture involving the outer table of the left frontal sinus with associated hemosinus again noted, better seen on prior CT. Sinuses/Orbits: Patient status post bilateral ocular lens replacement. Scattered mucosal thickening noted within the ethmoidal air cells. Layering blood within the left frontal sinus. Trace bilateral mastoid effusions, of doubtful significance. Inner ear structures grossly normal. Other: None. IMPRESSION: 1. 6 mm acute ischemic nonhemorrhagic infarct involving the posterior left periventricular white matter/left splenium. 2. Comminuted fracture involving the outer table of the left frontal sinus with associated hemosinus, better seen on prior CT. 3. Chronic lacunar infarcts involving the bilateral basal ganglia/corona radiata, left greater than right. Evidence for prior hemorrhage about the chronic left basal ganglia lacunar infarct. 4. Underlying age-related cerebral atrophy with moderate chronic microvascular ischemic disease. Electronically Signed   By: Jeannine Boga M.D.   On: 10/25/2018 03:58   Vas US Carotid  Result Date: 10/25/2018 Carotid Arterial Duplex Study Indications:  CVA. Performing Technologist: Toma Copier RVS  Examination Guidelines: A complete evaluation includes B-mode imaging, spectral Doppler, color Doppler, and power Doppler as needed of all accessible portions of each vessel. Bilateral testing is considered an integral part of a complete examination. Limited examinations for reoccurring indications may be performed as noted.  Right Carotid Findings: +----------+--------+--------+--------+---------------------+------------------+           PSV cm/sEDV cm/sStenosisDescribe             Comments            +----------+--------+--------+--------+---------------------+------------------+ CCA Prox  106     17                                   mild intimal                                                              changes            +----------+--------+--------+--------+---------------------+------------------+ CCA Distal141     19              diffuse and          mild plaque                                          heterogenous                            +----------+--------+--------+--------+---------------------+------------------+ ICA Prox  119     19      1-39%   diffuse and          mild plaque far                                      heterogenous         wall greater than                                                         near wall          +----------+--------+--------+--------+---------------------+------------------+ ICA Mid   81      25      1-39%   diffuse and          mild plaque                                          heterogenous                            +----------+--------+--------+--------+---------------------+------------------+ ICA Distal62      21                                                      +----------+--------+--------+--------+---------------------+------------------+  ECA       156     1               heterogenous         mild plaque at the                                                        origin             +----------+--------+--------+--------+---------------------+------------------+ +----------+--------+-------+--------+-------------------+           PSV cm/sEDV cmsDescribeArm Pressure (mmHG) +----------+--------+-------+--------+-------------------+ [id number]                                        +----------+--------+-------+--------+-------------------+ +---------+--------+--+--------+--+ VertebralPSV cm/s60EDV cm/s18 +---------+--------+--+--------+--+  Left  Carotid Findings: +----------+--------+--------+--------+---------------------+------------------+           PSV cm/sEDV cm/sStenosisDescribe             Comments           +----------+--------+--------+--------+---------------------+------------------+ CCA Prox  120     17                                   mild intimal                                                              changes            +----------+--------+--------+--------+---------------------+------------------+ CCA Distal96      15                                   mild intimal                                                              changes            +----------+--------+--------+--------+---------------------+------------------+ ICA Prox  100     17      1-39%   diffuse and          mild plaque                                          heterogenous                            +----------+--------+--------+--------+---------------------+------------------+ ICA Mid   79      29                                                      +----------+--------+--------+--------+---------------------+------------------+  ICA Distal91      31                                                      +----------+--------+--------+--------+---------------------+------------------+ ECA       141     4               heterogenous         mild to moderate                                                          plaque             +----------+--------+--------+--------+---------------------+------------------+ +----------+--------+--------+--------+-------------------+ SubclavianPSV cm/sEDV cm/sDescribeArm Pressure (mmHG) +----------+--------+--------+--------+-------------------+           128                                         +----------+--------+--------+--------+-------------------+ +---------+--------+--+--------+--+ VertebralPSV cm/s45EDV cm/s19  +---------+--------+--+--------+--+  Summary: Right Carotid: Velocities in the right ICA are consistent with a 1-39% stenosis. Left Carotid: Velocities in the left ICA are consistent with a 1-39% stenosis. Vertebrals:  Bilateral vertebral arteries demonstrate antegrade flow. Subclavians: Normal flow hemodynamics were seen in bilateral subclavian              arteries. *See table(s) above for measurements and observations.     Preliminary    Vas [place] Transcranial Doppler  Result Date: 10/25/2018  Transcranial Doppler Indications: Stroke. Performing Technologist: Toma Copier RVS  Examination Guidelines: A complete evaluation includes B-mode imaging, spectral Doppler, color Doppler, and power Doppler as needed of all accessible portions of each vessel. Bilateral testing is considered an integral part of a complete examination. Limited examinations for reoccurring indications may be performed as noted.  +----------+-------------+----------+-----------+-------+ RIGHT TCD Right VM (cm)Depth (cm)PulsatilityComment +----------+-------------+----------+-----------+-------+ MCA           76.00                 1.08            +----------+-------------+----------+-----------+-------+ ACA          -88.00                 0.91            +----------+-------------+----------+-----------+-------+ PCA           39.00                 0.95            +----------+-------------+----------+-----------+-------+ Opthalmic     11.00                 1.03            +----------+-------------+----------+-----------+-------+ ICA siphon    31.00                 1.31            +----------+-------------+----------+-----------+-------+  +---------+------------+----------+-----------+-------+ LEFT TCD Left VM (cm)Depth (cm)PulsatilityComment +---------+------------+----------+-----------+-------+ MCA         61.00  1.15             +---------+------------+----------+-----------+-------+ ACA         -72.00                 1.0            +---------+------------+----------+-----------+-------+ Term ICA    25.00                 1.76            +---------+------------+----------+-----------+-------+ PCA         35.00                 1.08            +---------+------------+----------+-----------+-------+ Opthalmic   13.00                 1.77            +---------+------------+----------+-----------+-------+ Vertebral   -45.00                0.84            +---------+------------+----------+-----------+-------+  +------------+-------+-------+             VM cm/sComment +------------+-------+-------+ Prox Basilar-44.00         +------------+-------+-------+ t  Technician Comments. Technically difficult due to suboptimal windows    Preliminary    Medications: . sodium chloride 100 mL/hr at 10/25/18 1438  . dialysis solution 2.5% low-MG/low-CA     . amLODipine  5 mg Oral Daily  . aspirin EC  81 mg Oral Daily  . atorvastatin  80 mg Oral q1800  . calcitRIOL  0.25 mcg Oral Daily  . carvedilol  12.5 mg Oral BID WC  . escitalopram  10 mg Oral Daily  . ferrous sulfate  325 mg Oral BID WC  . gentamicin cream  1 application Topical Daily  . hydrALAZINE  100 mg Oral BID  . isosorbide mononitrate  60 mg Oral Daily  . pantoprazole  40 mg Oral BID AC  . sevelamer carbonate  800 mg Oral TID WC  . sucralfate  1 g Oral TID WC & HS

## 2018-10-26 NOTE — Discharge Summary (Signed)
Physician Discharge Summary  Jesse Mcgee VEH:[id number] DOB: 1970/09/06 DOA: 10/24/2018  PCP: System, Pcp Not In  Admit date: 10/24/2018 Discharge date: 10/26/2018  Admitted From: home Disposition:  home   Recommendations for Outpatient Follow-up:  1. Needs ENT and GI follow ups 2. F/u on biopsies performed during EGD 3. Please check CBC at next dialysis  Home Health:  none  Equipment/Devices:  none    Discharge Condition:  stable   CODE STATUS:  Full code   Consultations:  GI  Neurology  ENT    Discharge Diagnoses:  Principal Problem:   Hematemesis  Active Problems:    Syncope   Hypokalemia   Closed fracture of frontal sinus (HCC)   Elevated troponin   Cerebral thrombosis with cerebral infarction   Ulcer of esophagus without bleeding   Gastritis and gastroduodenitis   Depression   ESRD on peritoneal dialysis ([place])   HLD (hyperlipidemia)   HTN (hypertension)      Brief Summary: Jesse Mcgee is a 48 y.o.malewith medical history significant ofESRD-onnightly peritoneal dialysis, hypertension, hyperlipidemia, depression,diet-controlled diabetes,who presents with syncope and hematemesis. He was getting up from the commode after having a BM and passed out for what he thinks was a few seconds. He subsequently had a couple episodes of vomiting which had blood in them. He had one more episode in the ED of vomiting with some blood.  An MRI to further work up the syncope showed a periventricular white matter/left splenium. 73mm acute ischemic nonhemorrhagic infarct involving the posterior left   periventricular white matter/left splenium.  Hospital Course:  Hematemesis - three episodes total- has resolved - his Hb is stable - EGD 5/2 > One cratered esophageal ulcer with no bleeding, linear ulcers in the distal esophagus, erythematous mucosa in cardia  - plan per GI >  Use Protonix (pantoprazole) 40 mg PO BID for 3 months, then decrease to once daily. Use  sucralfate suspension 1 gram PO QID for 2 2ks - CBC in 1 wk - oral Iron BID x 1 mo                                               Active Problems:   Syncope - either orthostatic of vasovagal in relation to BM and vomiting.  - he received IVF for dehydration and is no longer orthostatic - I have asked him to d/c Furosemide for now  Anemia of chronic disease - Hb is 10-11 range- follow    ESRD on peritoneal dialysis  - have notified nephrology and he has been dialyzed in the hospital     Cerebral thrombosis with cerebral infarction - incidental finding of infarct- will f/u on ECHO and carotid ultrasound - transcranial doppler ordered by Dr Leonie Man - stroke team has evaluated the patient and recommends a baby ASA daily- OK with GI to start this now    HTN (hypertension) - on Imdur, Hydralazine, Coreg     Closed fracture of frontal sinus  - ENT consulted- per ENT, it will be repaired as outpt when he follows up      Elevated troponin - likely due to ESRD- he does not recall having any chest pain prior to or after the syncope    Discharge Exam: Vitals:   10/26/18 0915 10/26/18 0930  BP: (!) 148/79 (!) 156/82  Pulse: 67 66  Resp: 20 18  Temp:  98 F (36.7 C)  SpO2: 100% 99%   Vitals:   10/26/18 0843 10/26/18 0900 10/26/18 0915 10/26/18 0930  BP: 122/90 (!) 142/77 (!) 148/79 (!) 156/82  Pulse: 74 68 67 66  Resp: 16 20 20 18   Temp: (!) 97.2 F (36.2 C)   98 F (36.7 C)  TempSrc:      SpO2: 100% 100% 100% 99%  Weight:      Height:        General: Pt is alert, awake, not in acute distress Cardiovascular: RRR, S1/S2 +, no rubs, no gallops Respiratory: CTA bilaterally, no wheezing, no rhonchi Abdominal: Soft, NT, ND, bowel sounds + Extremities: no edema, no cyanosis   Discharge Instructions  Discharge Instructions    Ambulatory referral to Neurology   Complete by:  As directed    Follow up with stroke clinic NP (Jessica Vanschaick or Cecille Rubin, if both  not available, consider Zachery Dauer, or [facility]) at [facility] in about 4 weeks. Thanks.     Allergies as of 10/26/2018   No Known Allergies     Medication List    STOP taking these medications   furosemide 80 MG tablet Commonly known as:  LASIX   omeprazole 20 MG capsule Commonly known as:  PRILOSEC     TAKE these medications   amLODipine 5 MG tablet Commonly known as:  NORVASC Take 1 tablet (5 mg total) by mouth daily. What changed:    how much to take  when to take this   aspirin EC 81 MG tablet Take 1 tablet (81 mg total) by mouth daily for 30 days.   atorvastatin 80 MG tablet Commonly known as:  LIPITOR Take 1 tablet (80 mg total) by mouth daily at 6 PM. What changed:  how much to take   calcitRIOL 0.25 MCG capsule Commonly known as:  ROCALTROL Take 0.25 mcg by mouth See admin instructions. MON thru FRI   carvedilol 25 MG tablet Commonly known as:  COREG Take 12.5 mg by mouth 2 (two) times daily with a meal.   escitalopram 10 MG tablet Commonly known as:  LEXAPRO Take 10 mg by mouth at bedtime.   ferrous sulfate 325 (65 FE) MG tablet Take 1 tablet (325 mg total) by mouth 2 (two) times daily with a meal.   hydrALAZINE 100 MG tablet Commonly known as:  APRESOLINE Take 1 tablet (100 mg total) by mouth 2 (two) times a day. What changed:    how much to take  when to take this   isosorbide mononitrate 60 MG 24 hr tablet Commonly known as:  IMDUR Take 60 mg by mouth daily.   metoCLOPramide 5 MG tablet Commonly known as:  REGLAN Take 5 mg by mouth 4 (four) times daily -  before meals and at bedtime.   pantoprazole 40 MG tablet Commonly known as:  PROTONIX Take 1 tablet (40 mg total) by mouth 2 (two) times daily before a meal.   sevelamer carbonate 800 MG tablet Commonly known as:  RENVELA Take 800 mg by mouth 3 (three) times daily with meals.   sucralfate 1 GM/10ML suspension Commonly known as:  CARAFATE Take 10 mLs (1 g total) by mouth 4 (four)  times daily -  with meals and at bedtime.   zolpidem 10 MG tablet Commonly known as:  AMBIEN Take 5 mg by mouth at bedtime.      Follow-up Information    [facility]. Schedule an appointment as soon as possible  for a visit in 4 week(s).   Specialty:  Neurology Contact information: [place] [phone]         No Known Allergies   Procedures/Studies: EGD  Ct Head Wo Contrast  Result Date: 10/24/2018 CLINICAL DATA:  48 year old male with syncope EXAM: CT HEAD WITHOUT CONTRAST TECHNIQUE: Contiguous axial images were obtained from the base of the skull through the vertex without intravenous contrast. COMPARISON:  None. FINDINGS: Brain: No acute intracranial hemorrhage. No midline shift or mass effect. Gray-white differentiation maintained. Patchy hypodensity in the right frontal white matter overlying the ventricle. Unremarkable appearance of the ventricular system. Vascular: Intracranial atherosclerosis Skull: Acute comminuted slightly depressed fracture of the frontal wall of the left frontal sinus with air-fluid level of the frontal sinus. The fracture extends to the left superior orbit and the superior medial orbital wall. No evidence of globe injury. Mild soft tissue swelling. The depressed fracture results in mild contour abnormality of the left frontal scalp soft tissues in the supraorbital region. No additional fracture identified. Fracture does not extend superiorly Sinuses/Orbits: No evidence of globe injury. Air-fluid level of the left frontal sinus. Other: None IMPRESSION: Acute comminuted slightly depressed fracture of the frontal wall of the left frontal sinus with associated small hemosinus. The fracture extends inferiorly to the superior medial orbit. No evidence of acute intracranial hemorrhage or globe injury. Intracranial atherosclerosis and mild white matter disease Electronically Signed   By: Corrie Mckusick D.O.   On: 10/24/2018 15:49   Mr Brain Wo Contrast  Result Date: 10/25/2018 CLINICAL DATA:  Initial evaluation for acute syncope. EXAM: MRI HEAD WITHOUT CONTRAST TECHNIQUE: Multiplanar, multiecho pulse sequences of the brain and surrounding structures were obtained without intravenous contrast. COMPARISON:  Prior CT from 10/24/2018 FINDINGS: Brain: Diffuse prominence of the CSF containing spaces compatible with generalized age-related cerebral atrophy. Patchy and confluent T2/FLAIR hyperintensity within the periventricular white matter most consistent with chronic small vessel ischemic disease, mild to moderate nature. Remote lacunar infarcts seen involving the bilateral basal ganglia/corona radiata, left larger than right. Associated chronic hemosiderin staining about the chronic left basal ganglia infarct. Chronic microvascular ischemic changes noted within the pons as well. No mass lesion, midline shift, or mass effect. Mild ex vacuo dilatation of the left lateral ventricle related to the chronic left basal ganglia lacunar infarct. No hydrocephalus. No discernible extra-axial fluid collection. Pituitary gland suprasellar region normal. Midline structures intact. 6 mm focus of restricted diffusion involving the left posterior periventricular white matter/splenium compatible with an acute ischemic infarct. No associated hemorrhage or mass effect. No other abnormal foci of restricted diffusion to suggest acute or subacute ischemia. Gray-white matter differentiation otherwise maintained. No encephalomalacia to suggest chronic cortical infarction. No other evidence for acute or chronic intracranial hemorrhage. Vascular: Major intracranial vascular flow voids are well maintained. Skull and upper cervical spine: Craniocervical junction within normal limits. Upper cervical spine normal. Comminuted fracture involving the outer table of the left frontal sinus with associated hemosinus again noted, better seen on  prior CT. Sinuses/Orbits: Patient status post bilateral ocular lens replacement. Scattered mucosal thickening noted within the ethmoidal air cells. Layering blood within the left frontal sinus. Trace bilateral mastoid effusions, of doubtful significance. Inner ear structures grossly normal. Other: None. IMPRESSION: 1. 6 mm acute ischemic nonhemorrhagic infarct involving the posterior left periventricular white matter/left splenium. 2. Comminuted fracture involving the outer table of the left frontal sinus with associated hemosinus, better seen on prior CT. 3. Chronic  lacunar infarcts involving the bilateral basal ganglia/corona radiata, left greater than right. Evidence for prior hemorrhage about the chronic left basal ganglia lacunar infarct. 4. Underlying age-related cerebral atrophy with moderate chronic microvascular ischemic disease. Electronically Signed   By: Jeannine Boga M.D.   On: 10/25/2018 03:58   Vas US Carotid  Result Date: 10/25/2018 Carotid Arterial Duplex Study Indications: CVA. Performing Technologist: Toma Copier RVS  Examination Guidelines: A complete evaluation includes B-mode imaging, spectral Doppler, color Doppler, and power Doppler as needed of all accessible portions of each vessel. Bilateral testing is considered an integral part of a complete examination. Limited examinations for reoccurring indications may be performed as noted.  Right Carotid Findings: +----------+--------+--------+--------+---------------------+------------------+           PSV cm/sEDV cm/sStenosisDescribe             Comments           +----------+--------+--------+--------+---------------------+------------------+ CCA Prox  106     17                                   mild intimal                                                              changes            +----------+--------+--------+--------+---------------------+------------------+ CCA Distal141     19               diffuse and          mild plaque                                          heterogenous                            +----------+--------+--------+--------+---------------------+------------------+ ICA Prox  119     19      1-39%   diffuse and          mild plaque far                                      heterogenous         wall greater than                                                         near wall          +----------+--------+--------+--------+---------------------+------------------+ ICA Mid   81      25      1-39%   diffuse and          mild plaque  heterogenous                            +----------+--------+--------+--------+---------------------+------------------+ ICA Distal62      21                                                      +----------+--------+--------+--------+---------------------+------------------+ ECA       156     1               heterogenous         mild plaque at the                                                        origin             +----------+--------+--------+--------+---------------------+------------------+ +----------+--------+-------+--------+-------------------+           PSV cm/sEDV cmsDescribeArm Pressure (mmHG) +----------+--------+-------+--------+-------------------+ [id number]                                        +----------+--------+-------+--------+-------------------+ +---------+--------+--+--------+--+ VertebralPSV cm/s60EDV cm/s18 +---------+--------+--+--------+--+  Left Carotid Findings: +----------+--------+--------+--------+---------------------+------------------+           PSV cm/sEDV cm/sStenosisDescribe             Comments           +----------+--------+--------+--------+---------------------+------------------+ CCA Prox  120     17                                   mild intimal                                                               changes            +----------+--------+--------+--------+---------------------+------------------+ CCA Distal96      15                                   mild intimal                                                              changes            +----------+--------+--------+--------+---------------------+------------------+ ICA Prox  100     17      1-39%   diffuse and          mild plaque  heterogenous                            +----------+--------+--------+--------+---------------------+------------------+ ICA Mid   79      29                                                      +----------+--------+--------+--------+---------------------+------------------+ ICA Distal91      31                                                      +----------+--------+--------+--------+---------------------+------------------+ ECA       141     4               heterogenous         mild to moderate                                                          plaque             +----------+--------+--------+--------+---------------------+------------------+ +----------+--------+--------+--------+-------------------+ SubclavianPSV cm/sEDV cm/sDescribeArm Pressure (mmHG) +----------+--------+--------+--------+-------------------+           128                                         +----------+--------+--------+--------+-------------------+ +---------+--------+--+--------+--+ VertebralPSV cm/s45EDV cm/s19 +---------+--------+--+--------+--+  Summary: Right Carotid: Velocities in the right ICA are consistent with a 1-39% stenosis. Left Carotid: Velocities in the left ICA are consistent with a 1-39% stenosis. Vertebrals:  Bilateral vertebral arteries demonstrate antegrade flow. Subclavians: Normal flow hemodynamics were seen in bilateral subclavian              arteries. *See table(s)  above for measurements and observations.     Preliminary    Vas [place] Transcranial Doppler  Result Date: 10/25/2018  Transcranial Doppler Indications: Stroke. Performing Technologist: Toma Copier RVS  Examination Guidelines: A complete evaluation includes B-mode imaging, spectral Doppler, color Doppler, and power Doppler as needed of all accessible portions of each vessel. Bilateral testing is considered an integral part of a complete examination. Limited examinations for reoccurring indications may be performed as noted.  +----------+-------------+----------+-----------+-------+ RIGHT TCD Right VM (cm)Depth (cm)PulsatilityComment +----------+-------------+----------+-----------+-------+ MCA           76.00                 1.08            +----------+-------------+----------+-----------+-------+ ACA          -88.00                 0.91            +----------+-------------+----------+-----------+-------+ PCA           39.00                 0.95            +----------+-------------+----------+-----------+-------+  Opthalmic     11.00                 1.03            +----------+-------------+----------+-----------+-------+ ICA siphon    31.00                 1.31            +----------+-------------+----------+-----------+-------+  +---------+------------+----------+-----------+-------+ LEFT TCD Left VM (cm)Depth (cm)PulsatilityComment +---------+------------+----------+-----------+-------+ MCA         61.00                 1.15            +---------+------------+----------+-----------+-------+ ACA         -72.00                 1.0            +---------+------------+----------+-----------+-------+ Term ICA    25.00                 1.76            +---------+------------+----------+-----------+-------+ PCA         35.00                 1.08            +---------+------------+----------+-----------+-------+ Opthalmic   13.00                 1.77             +---------+------------+----------+-----------+-------+ Vertebral   -45.00                0.84            +---------+------------+----------+-----------+-------+  +------------+-------+-------+             VM cm/sComment +------------+-------+-------+ Prox Basilar-44.00         +------------+-------+-------+ t  Technician Comments. Technically difficult due to suboptimal windows    Preliminary      The results of significant diagnostics from this hospitalization (including imaging, microbiology, ancillary and laboratory) are listed below for reference.     Microbiology: No results found for this or any previous visit (from the past 240 hour(s)).   Labs: BNP (last 3 results) No results for input(s): BNP in the last 8760 hours. Basic Metabolic Panel: Recent Labs  Lab 10/24/18 1524 10/25/18 0428  NA 133* 137  K 3.1* 3.6  CL 92* 94*  CO2 28 29  GLUCOSE 141* 90  BUN 43* 48*  CREATININE 11.40* 12.42*  CALCIUM 8.1* 8.2*   Liver Function Tests: Recent Labs  Lab 10/24/18 1524  AST 18  ALT 15  ALKPHOS 47  BILITOT 0.8  PROT 6.5  ALBUMIN 3.1*   No results for input(s): LIPASE, AMYLASE in the last 168 hours. No results for input(s): AMMONIA in the last 168 hours. CBC: Recent Labs  Lab 10/24/18 1524 10/24/18 2137 10/25/18 0428 10/25/18 0751 10/25/18 1255  WBC 7.6 8.5 8.3 8.1  --   NEUTROABS 5.3  --   --   --   --   HGB 11.2* 10.9* 10.0* 10.2* 9.5*  HCT 34.4* 33.8* 30.6* 31.1* 29.1*  MCV 93.0 91.6 91.1 90.7  --   PLT 163 172 172 171  --    Cardiac Enzymes: Recent Labs  Lab 10/24/18 2145 10/25/18 0428 10/25/18 0751 10/25/18 1255  TROPONINI 0.13* 0.12* 0.16* 0.16*   BNP: Invalid input(s): POCBNP CBG: Recent Labs  Lab 10/24/18 1515  10/24/18 2220 10/26/18 0848  GLUCAP 131* 96 77   D-Dimer No results for input(s): DDIMER in the last 72 hours. Hgb A1c Recent Labs    10/25/18 0428  HGBA1C 7.5*   Lipid Profile Recent Labs     10/25/18 0428  CHOL 132  HDL 37*  LDLCALC 80  TRIG 74  CHOLHDL 3.6   Thyroid function studies No results for input(s): TSH, T4TOTAL, T3FREE, THYROIDAB in the last 72 hours.  Invalid input(s): FREET3 Anemia work up No results for input(s): VITAMINB12, FOLATE, FERRITIN, TIBC, IRON, RETICCTPCT in the last 72 hours. Urinalysis No results found for: COLORURINE, APPEARANCEUR, LABSPEC, [place], GLUCOSEU, HGBUR, BILIRUBINUR, KETONESUR, PROTEINUR, UROBILINOGEN, NITRITE, LEUKOCYTESUR Sepsis Labs Invalid input(s): PROCALCITONIN,  WBC,  LACTICIDVEN Microbiology No results found for this or any previous visit (from the past 240 hour(s)).   Time coordinating discharge in minutes: 65  SIGNED:   Debbe Odea, MD  [facility] 10/26/2018, 1:33 PM Pager   If 7PM-7AM, please contact night-coverage [email or web address] Password [email or web address]

## 2018-10-26 NOTE — Transfer of Care (Signed)
Immediate Anesthesia Transfer of Care Note  Patient: Jesse Mcgee  Procedure(s) Performed: ESOPHAGOGASTRODUODENOSCOPY (EGD) WITH PROPOFOL (N/A ) BIOPSY  Patient Location: PACU  Anesthesia Type:General  Level of Consciousness: awake, alert  and oriented  Airway & Oxygen Therapy: Patient Spontanous Breathing  Post-op Assessment: Report given to RN and Post -op Vital signs reviewed and stable  Post vital signs: Reviewed and stable  Last Vitals:  Vitals Value Taken Time  BP 122/90 10/26/2018  8:43 AM  Temp    Pulse 77 10/26/2018  8:46 AM  Resp 18 10/26/2018  8:46 AM  SpO2 100 % 10/26/2018  8:46 AM  Vitals shown include unvalidated device data.  Last Pain:  Vitals:   10/26/18 0740  TempSrc:   PainSc: 0-No pain      Patients Stated Pain Goal: 2 (08/18/34 1224)  Complications: No apparent anesthesia complications

## 2018-10-26 NOTE — Op Note (Addendum)
[facility] Patient Name: Jesse Mcgee Procedure Date : 10/26/2018 MRN: [id number] Attending MD: Mauri Pole , MD Date of Birth: 09-12-1970 CSN: [id number] Age: 48 Admit Type: Inpatient Procedure:                Upper GI endoscopy Indications:              Active gastrointestinal bleeding, Hematemesis Providers:                Mauri Pole, MD, Raynelle Bring, RN, Ladona Ridgel, Technician, Charolette Child, Technician,                            Clearnce Sorrel, CRNA Referring MD:              Medicines:                Monitored Anesthesia Care Complications:            No immediate complications. Estimated Blood Loss:     Estimated blood loss was minimal. Procedure:                Pre-Anesthesia Assessment:                           - Prior to the procedure, a History and Physical                            was performed, and patient medications and                            allergies were reviewed. The patient's tolerance of                            previous anesthesia was also reviewed. The risks                            and benefits of the procedure and the sedation                            options and risks were discussed with the patient.                            All questions were answered, and informed consent                            was obtained. Prior Anticoagulants: The patient has                            taken no previous anticoagulant or antiplatelet                            agents. ASA Grade Assessment: III - A patient with  severe systemic disease. After reviewing the risks                            and benefits, the patient was deemed in                            satisfactory condition to undergo the procedure.                           After obtaining informed consent, the endoscope was                            passed under direct vision. Throughout the                             procedure, the patient's blood pressure, pulse, and                            oxygen saturations were monitored continuously. The                            GIF-H190 ([id number]) Olympus gastroscope was                            introduced through the mouth, and advanced to the                            second part of duodenum. The upper GI endoscopy was                            accomplished without difficulty. The patient                            tolerated the procedure well. Scope In: Scope Out: Findings:      One cratered esophageal ulcer with no bleeding was found 38 cm from the       incisors. The lesion was less than one mm in largest dimension.       Additional linear ulcers in the distal esophagus      LA Grade C (one or more mucosal breaks continuous between tops of 2 or       more mucosal folds, less than 75% circumference) esophagitis with no       bleeding was found 34 to 42 cm from the incisors.      Localized moderately erythematous mucosa without bleeding was found in       the cardia. Biopsies were taken with a cold forceps for histology.      Patchy mild inflammation characterized by congestion (edema) and       erythema was found in the entire examined stomach. Biopsies were taken       with a cold forceps for Helicobacter pylori testing.      The examined duodenum was normal. Impression:               - Non-bleeding esophageal ulcer.                           -  LA Grade C erosive esophagitis.                           - Erythematous mucosa in the cardia. Biopsied.                           - Gastritis. Biopsied.                           - Normal examined duodenum. Recommendation:           - Patient has a contact number available for                            emergencies. The signs and symptoms of potential                            delayed complications were discussed with the                            patient. Return to normal activities tomorrow.                             Written discharge instructions were provided to the                            patient.                           - Resume previous diet.                           - Continue present medications.                           - Use Protonix (pantoprazole) 40 mg PO BID for 3                            months, then decrease to once daily.                           - Use sucralfate suspension 1 gram PO QID for 2                            weeks.                           - Follow up CBC as outpatient by PMD in 1 week                           - Oral iron 325mg  twice daily with meals X 1 month                           - Continue ASA 81 mg                           -  OK to restart Plavix if indicated in 5 days,                            management per Neurology and Hospitalist team                           - Will sign off, available if have any questions Procedure Code(s):        --- Professional ---                           [id number], Esophagogastroduodenoscopy, flexible,                            transoral; with biopsy, single or multiple Diagnosis Code(s):        --- Professional ---                           K22.10, Ulcer of esophagus without bleeding                           K20.8, Other esophagitis                           K31.89, Other diseases of stomach and duodenum                           K29.70, Gastritis, unspecified, without bleeding                           K92.2, Gastrointestinal hemorrhage, unspecified                           K92.0, Hematemesis CPT copyright 2019 [employer]. All rights reserved. The codes documented in this report are preliminary and upon coder review may  be revised to meet current compliance requirements. Mauri Pole, MD 10/26/2018 8:46:58 AM This report has been signed electronically. Number of Addenda: 0

## 2018-10-28 ENCOUNTER — Encounter (HOSPITAL_COMMUNITY): Payer: Self-pay | Admitting: Gastroenterology

## 2018-12-11 ENCOUNTER — Inpatient Hospital Stay: Payer: BLUE CROSS/BLUE SHIELD | Admitting: Adult Health

## 2020-10-28 IMAGING — MR MRI HEAD WITHOUT CONTRAST
12 of 13 series · 43 of 48 positions shown · non-contrast
Comparison: Prior CT from 10/24/2018

CLINICAL DATA: Initial evaluation for acute syncope.

EXAM:
MRI HEAD WITHOUT CONTRAST
TECHNIQUE: Multiplanar, multiecho pulse sequences of the brain and surrounding
structures were obtained without intravenous contrast.

[Series 5: DWI · axial · 4.0mm · 0.88mm/px · z∈[-36,+97]mm · 5 of 70 slices shown (1 of 4)]
[im 1/70]
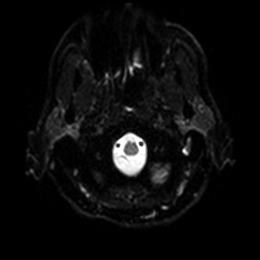
[im 18/70]
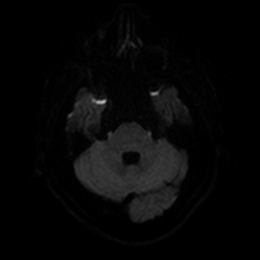
[im 35/70]
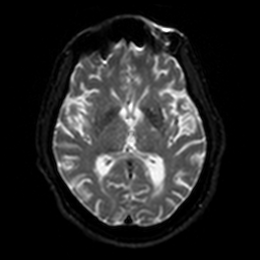
[im 52/70]
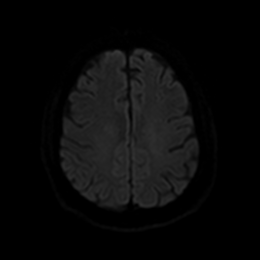
[im 70/70]
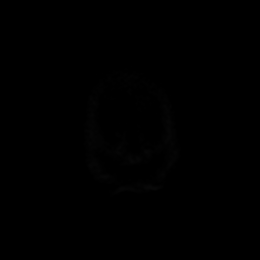

[Series 6: DWI · axial · 4.0mm · 0.88mm/px · z∈[-36,+97]mm · 3 of 35 slices shown (2 of 4)]
[im 1/35]
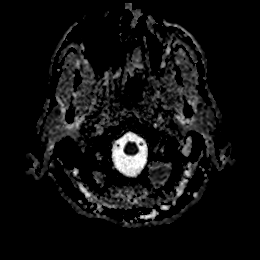
[im 18/35]
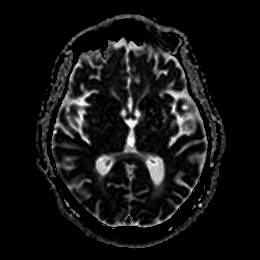
[im 35/35]
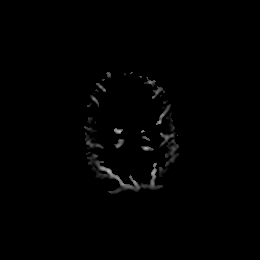

[Series 7: DWI · coronal · 4.0mm · 0.88mm/px · 6 of 70 slices shown (3 of 4)]
[im 1/70]
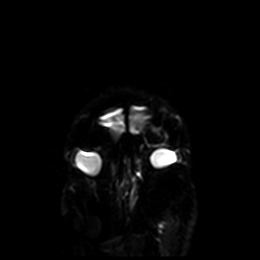
[im 14/70]
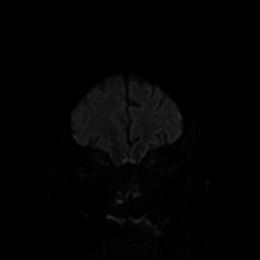
[im 28/70]
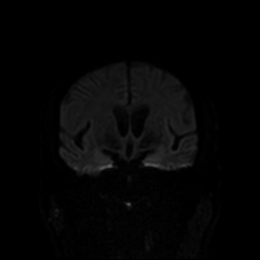
[im 42/70]
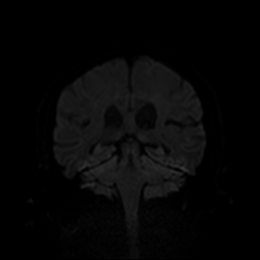
[im 56/70]
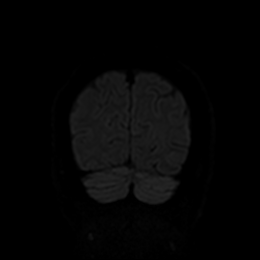
[im 70/70]
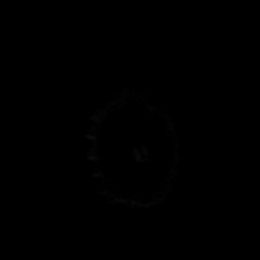

[Series 8: DWI · coronal · 4.0mm · 0.88mm/px · 3 of 35 slices shown (4 of 4)]
[im 1/35]
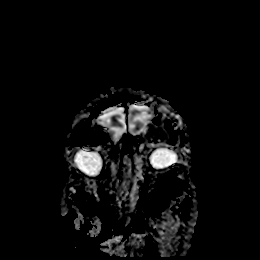
[im 18/35]
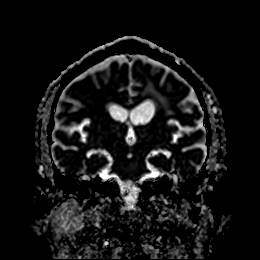
[im 35/35]
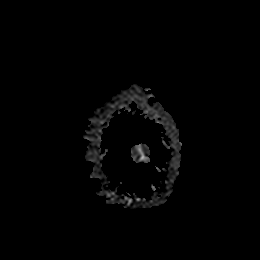

[Series 9: T1 · sagittal · 5.0mm · 0.75mm/px · 2 of 23 slices shown]
[im 1/23]
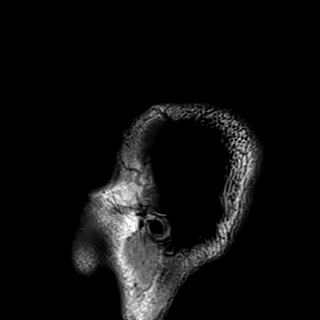
[im 23/23]
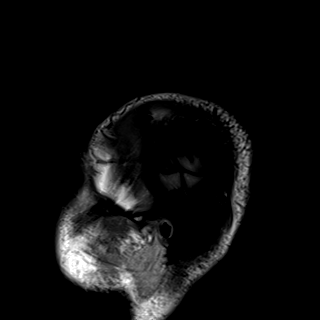

[Series 10: T2 · axial · 5.0mm · 0.72mm/px · z∈[-41,+99]mm · 2 of 25 slices shown (1 of 2)]
[im 1/25]
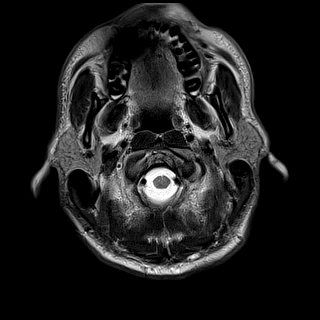
[im 25/25]
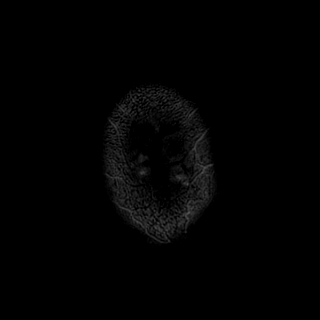

[Series 11: FLAIR · axial · 5.0mm · 0.45mm/px · z∈[-42,+99]mm · 2 of 25 slices shown]
[im 1/25]
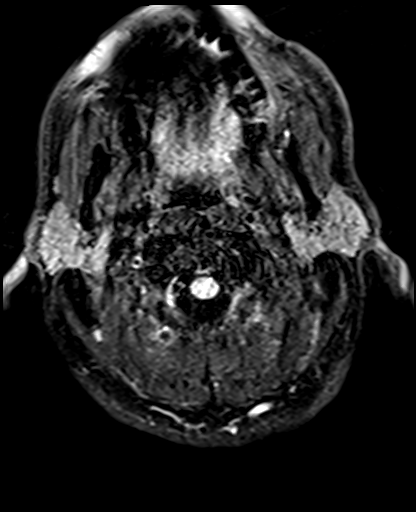
[im 25/25]
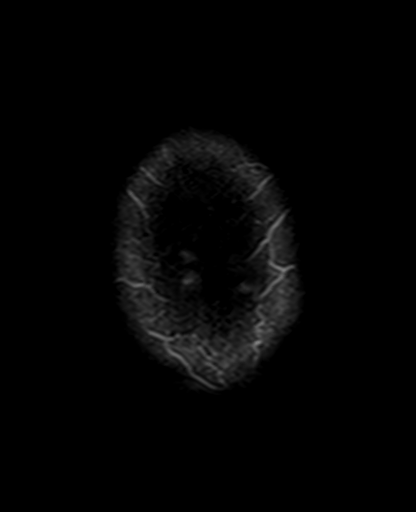

[Series 12: mag_images · axial · 3.0mm · 0.90mm/px · z∈[-54,+119]mm · 5 of 60 slices shown]
[im 1/60]
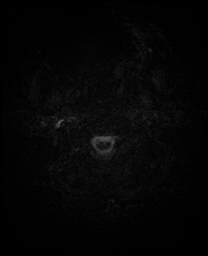
[im 15/60]
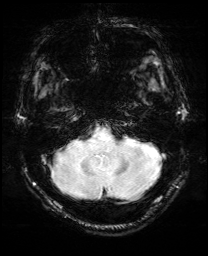
[im 30/60]
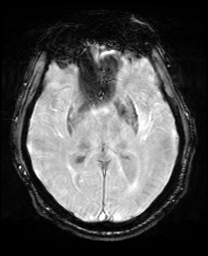
[im 45/60]
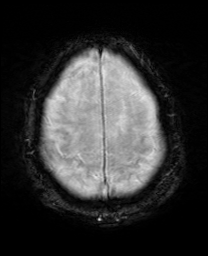
[im 60/60]
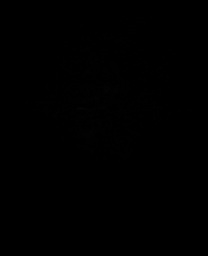

[Series 13: pha_images · axial · 3.0mm · 0.90mm/px · z∈[-51,+110]mm · 4 of 56 slices shown]
[im 1/56]
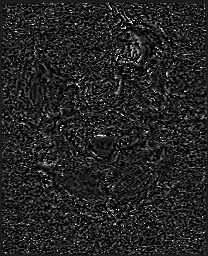
[im 19/56]
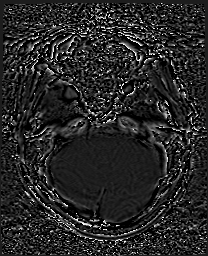
[im 37/56]
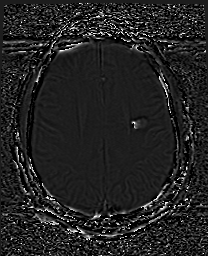
[im 56/56]
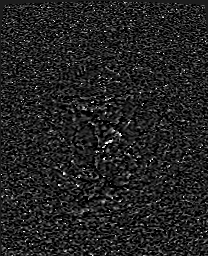

[Series 14: swi_images · axial · 3.0mm · 0.90mm/px · z∈[-54,+119]mm · 5 of 60 slices shown]
[im 1/60]
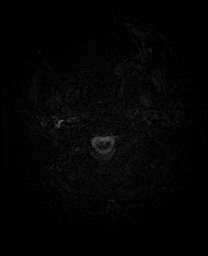
[im 15/60]
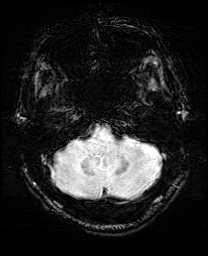
[im 30/60]
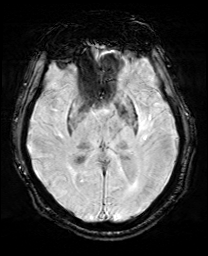
[im 45/60]
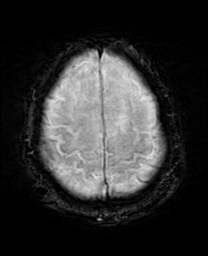
[im 60/60]
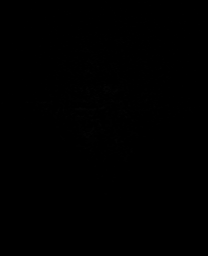

[Series 15: mip_images(sw) · axial · 24.0mm · 0.90mm/px · z∈[-44,+109]mm · 4 of 53 slices shown]
[im 1/53]
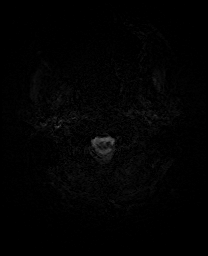
[im 18/53]
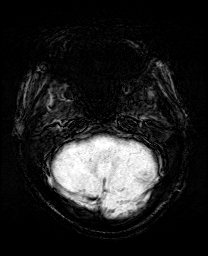
[im 35/53]
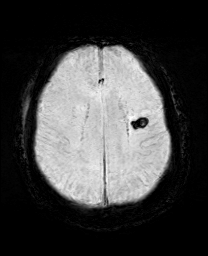
[im 53/53]
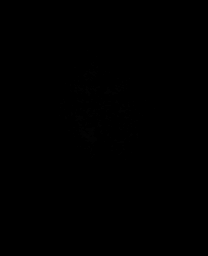

[Series 17: T2 · coronal · 5.0mm · 0.72mm/px · 2 of 28 slices shown (2 of 2)]
[im 1/28]
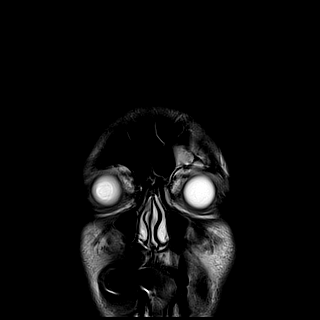
[im 28/28]
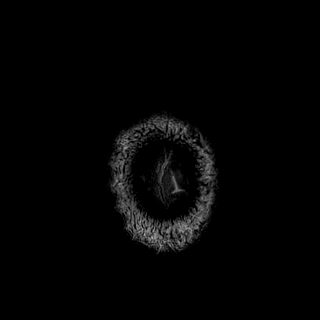

[43 of 48 positions shown; findings below may reference images not displayed]

FINDINGS: Brain: Diffuse prominence of the CSF containing spaces compatible
with generalized age-related cerebral atrophy. Patchy and confluent
T2/FLAIR hyperintensity within the periventricular white matter most
consistent with chronic small vessel ischemic disease, mild to
moderate nature. Remote lacunar infarcts seen involving the
bilateral basal ganglia/corona radiata, left larger than right.
Associated chronic hemosiderin staining about the chronic left basal
ganglia infarct. Chronic microvascular ischemic changes noted within
the pons as well.

No mass lesion, midline shift, or mass effect. Mild ex vacuo
dilatation of the left lateral ventricle related to the chronic left
basal ganglia lacunar infarct. No hydrocephalus. No discernible
extra-axial fluid collection.

Pituitary gland suprasellar region normal. Midline structures
intact.

6 mm focus of restricted diffusion involving the left posterior
periventricular white matter/splenium compatible with an acute
ischemic infarct. No associated hemorrhage or mass effect. No other
abnormal foci of restricted diffusion to suggest acute or subacute
ischemia. Gray-white matter differentiation otherwise maintained. No
encephalomalacia to suggest chronic cortical infarction. No other
evidence for acute or chronic intracranial hemorrhage.

Vascular: Major intracranial vascular flow voids are well
maintained.

Skull and upper cervical spine: Craniocervical junction within
normal limits. Upper cervical spine normal. Comminuted fracture
involving the outer table of the left frontal sinus with associated
hemosinus again noted, better seen on prior CT.

Sinuses/Orbits: Patient status post bilateral ocular lens
replacement. Scattered mucosal thickening noted within the ethmoidal
air cells. Layering blood within the left frontal sinus. Trace
bilateral mastoid effusions, of doubtful significance. Inner ear
structures grossly normal.

Other: None.
IMPRESSION: 1. 6 mm acute ischemic nonhemorrhagic infarct involving the
posterior left periventricular white matter/left splenium.
2. Comminuted fracture involving the outer table of the left frontal
sinus with associated hemosinus, better seen on prior CT.
3. Chronic lacunar infarcts involving the bilateral basal
ganglia/corona radiata, left greater than right. Evidence for prior
hemorrhage about the chronic left basal ganglia lacunar infarct.
4. Underlying age-related cerebral atrophy with moderate chronic
microvascular ischemic disease.
